# Patient Record
Sex: Male | Born: 1945 | Race: Black or African American | Hispanic: No | Marital: Married | Smoking: Former smoker
Health system: Southern US, Community
[De-identification: ages and names within clinical notes are randomized; demographics above are authoritative.]

## PROBLEM LIST (undated history)

## (undated) DIAGNOSIS — E785 Hyperlipidemia, unspecified: Secondary | ICD-10-CM

## (undated) DIAGNOSIS — I10 Essential (primary) hypertension: Secondary | ICD-10-CM

## (undated) DIAGNOSIS — M109 Gout, unspecified: Secondary | ICD-10-CM

## (undated) DIAGNOSIS — C801 Malignant (primary) neoplasm, unspecified: Secondary | ICD-10-CM

## (undated) HISTORY — DX: Hyperlipidemia, unspecified: E78.5

## (undated) HISTORY — DX: Gout, unspecified: M10.9

## (undated) HISTORY — DX: Essential (primary) hypertension: I10

## (undated) HISTORY — PX: COLONOSCOPY: SHX174

## (undated) HISTORY — PX: PROSTATE SURGERY: SHX751

## (undated) HISTORY — DX: Malignant (primary) neoplasm, unspecified: C80.1

---

## 2006-08-21 ENCOUNTER — Ambulatory Visit (HOSPITAL_COMMUNITY): Admission: RE | Admit: 2006-08-21 | Discharge: 2006-08-21 | Payer: Self-pay | Admitting: Urology

## 2006-11-07 ENCOUNTER — Inpatient Hospital Stay (HOSPITAL_COMMUNITY): Admission: RE | Admit: 2006-11-07 | Discharge: 2006-11-08 | Payer: Self-pay | Admitting: Urology

## 2006-11-07 ENCOUNTER — Encounter (INDEPENDENT_AMBULATORY_CARE_PROVIDER_SITE_OTHER): Payer: Self-pay | Admitting: Urology

## 2007-05-28 ENCOUNTER — Ambulatory Visit: Admission: RE | Admit: 2007-05-28 | Discharge: 2007-08-26 | Payer: Self-pay | Admitting: Radiation Oncology

## 2010-06-28 NOTE — Op Note (Signed)
NAMELUCIANO, [name]                ACCOUNT NO.:  [id number]   MEDICAL RECORD NO.:  [id number]          PATIENT TYPE:  INP   LOCATION:  1440                         FACILITY:  [facility]   PHYSICIAN:  Valetta Fuller, M.D.  DATE OF BIRTH:  January 28, 1946   DATE OF PROCEDURE:  11/07/2006  DATE OF DISCHARGE:  11/08/2006                               OPERATIVE REPORT   PREOPERATIVE DIAGNOSIS:  Adenocarcinoma of the prostate.   POSTOPERATIVE DIAGNOSIS:  Adenocarcinoma of the prostate.   PROCEDURE PERFORMED:  Robotic-assisted laparoscopic radical retropubic  prostatectomy with bilateral pelvic lymph node dissection.   SURGEON:  Valetta Fuller, M.D.   ASSISTANT:  Heloise Purpura, M.D.   ANESTHESIA:  General endotracheal   INDICATIONS:  Mr. [name] is a 65 year old male.  The patient was  originally seen and evaluated by Dr. Logan Bores in our office.  The patient  had had some longstanding erectile dysfunction and had a PSA done  earlier this summer which was markedly elevated at 33.  Rectal exam at  that time showed mild to moderate enlargement but no other worrisome  features.  The patient's prostate volume was around 50 grams.  His  biopsies revealed primarily adenocarcinoma of the prostate on the right  side.  The patient had moderate volume disease with primarily tumor at  the right base and right midportion of the prostate.  There, about 50%  of biopsy material was positive for Gleason 4 + 3 = 7 as well as some  Gleason 3 + 4 = 7 tumor.  CT and bone scan showed no evidence of  metastatic disease.  The patient's preoperative sexual function score  was quite low with minimal voiding complaints.  The patient was noted to  have a significant left inguinal hernia and was sent to general surgery  for consultation.  The plan was to fix this at a later date.  The  patient understood the advantages and disadvantages of this type of  surgery.  He understood there were several different approaches to  managing adenocarcinoma of the prostate of this nature.  He understood  that there was quite a high likelihood of positive margin disease given  his PSA in the 30-40 range.  He understood that adjuvant therapy was  potentially likely.  The patient appeared to understand the advantages,  disadvantages and complications that can occur with this type of  surgery.  The patient has gone to preoperative classes.  He has had some  preoperative physical therapy.  The patient now presents for the  surgery.  He has done a mechanical bowel prep and has received IV  Unasyn.  Compression boots were placed preoperatively.   TECHNIQUE AND FINDINGS:  The patient was brought to operating room where  he had successful induction of general endotracheal anesthesia.  He was  placed in lithotomy position and then into moderate to steep  Trendelenburg.  He was prepped draped in the usual manner.  Sterile  Foley catheter was inserted on the table.   The initial incision site was chosen 18 cm above the pubic symphysis  just to  left of the umbilicus.  A standard open Hasson technique was  utilized.  Fascia was exposed.  The peritoneal cavity was entered again  in the standard open technique, and no obvious adhesions were noted.  The initial 12-mm trocar was then placed, and the abdomen was  insufflated without difficulty.  Careful inspection of the abdomen did  reveal a large left inguinal hernia containing colon.  No other  abnormalities were appreciated.  All other trocars were placed with  direct visual guidance including three 8-mm trocar ports for the robotic  arms, a 12-mm and a 5-mm assist port.  After placement of all the  trocars, the robot was then docked.  The bladder was filled to allow for  entry into the space of Retzius.  Electrocautery scissors were used to  reflect the bladder posteriorly allowing for the entry into the space of  Retzius.  Endopelvic fascia was identified, and the overlying  prostate  and superficial dorsal vein complex were dissected clear, and some  superficial defatting was performed.  The endopelvic fascia was then  incised bilaterally.  Puboprostatic ligaments were taken down sharply,  and once the levator musculature was swept off the apex of the prostate,  the dorsal venous complex was isolated and then stapled with an ETS  stapling device.  With the use of the Foley catheter, we were able to  identify the anterior bladder neck, and electrocautery scissors were  used to incise the anterior bladder neck.  The Foley catheter was  identified.  There was no evidence of middle lobe.  Indigo carmine was  given to identify the ureteral orifices which were well back from the  incision site.  The posterior bladder neck was then transected.  Seminal  vesicles and vas structures were then identified posteriorly, and each  of these structures were individually dissected free.  With anterior  traction, the posterior plane between the rectum and prostate was then  easily established.   Attention was then turned towards isolation of the pedicles.  We did go  ahead and sweep off the neurovascular bundle off the left side of the  prostate where all the biopsies were negative.  We realized that the  patient had significant preoperative erectile dysfunction, but there may  be some improvement with sparing of the neurovascular bundle on that  side.  On the contralateral right side, wide excision of the  neurovascular bundle was accomplished.  The vascular pedicles were  isolated, and Hem-o-lok clips were used for those.  At that point, the  prostate remained attached only to the apex and urethra.  Urethra was  divided anteriorly.  The catheter was retracted, and the posterior  urethra was then transected.  The prostatic specimen was then brought  out of the pelvis.  Irrigation of pelvis revealed no active bleeding and  no evidence of rectal injury.   Attention was then  turned towards bilateral pelvic lymph node  dissection.  There were a few slightly enlarged nodes, but nothing  bigger than 1 cm bilaterally.  Obturator packets were taken back to the  bifurcation of the iliac artery.  Clips were used.  Obturator nerves and  vessels were identified bilaterally and preserved.  Both of these bundle  packets were taken out separately.   Attention was then turned towards reconstruction.  A 2-0 Vicryl suture  was utilized to reconstruct Denonvilliers fascia to provide floor and  support for the bladder and bladder neck region.  Once this was  accomplished, we placed a single 2-0 Vicryl suture at the 6 o'clock  position between the posterior urethra and posterior bladder neck.  Again, indigo carmine was given to help identify the orifices.  Once the  urethra and bladder neck were reapproximated, we then used a double-  armed 3-0 Monocryl suture to perform a running 360 degrees anastomosis.  A new coude catheter was then placed and the bladder irrigated with no  evidence of leakage and what appeared to be a good watertight  anastomosis.  The pelvic drain was placed through one of the obturator  ports and positioned in the retropubic space and then secured to the  skin.   Attention was then turned towards specimen removal.  An Endopouch was  used to obtain the prostate.  The 12-mm assist port was closed with 0  Vicryl suture with the aid of a suture passer.  The midline incision was  slightly enlarged to remove the prostatic specimen.  That incision was  then closed with a running 0 Vicryl suture.  All wounds were infiltrated  with Marcaine, and clips were used for skin closure.  The patient  appeared to tolerate the procedure well.  There were no obvious  complications, and blood loss was within a few hundred mL.  He was  brought to the recovery room in stable condition.          ______________________________  Valetta Fuller, M.D.  Electronically  Signed    DSG/MEDQ  D:  11/12/2006  T:  11/12/2006  Job:  [id number]

## 2010-11-24 LAB — DIFFERENTIAL
Eosinophils Absolute: 0
Lymphocytes Relative: 18
Lymphs Abs: 2
Monocytes Relative: 7
Neutrophils Relative %: 75

## 2010-11-24 LAB — BASIC METABOLIC PANEL
CO2: 25
Chloride: 103
Chloride: 105
Creatinine, Ser: 1
GFR calc Af Amer: >60
GFR calc Af Amer: >60
Potassium: 3.4 — ABNORMAL LOW
Potassium: 3.8
Sodium: 136

## 2010-11-24 LAB — HEMOGLOBIN AND HEMATOCRIT, BLOOD: HCT: 41.3

## 2010-11-24 LAB — CBC
HCT: 36.1 — ABNORMAL LOW
MCV: 99.2
RBC: 3.64 — ABNORMAL LOW
WBC: 10.9 — ABNORMAL HIGH

## 2012-04-24 ENCOUNTER — Ambulatory Visit (INDEPENDENT_AMBULATORY_CARE_PROVIDER_SITE_OTHER): Payer: Medicare Other | Admitting: Family Medicine

## 2012-04-24 VITALS — BP 130/64 | HR 81 | Temp 98.1°F | Resp 16 | Ht 70.0 in | Wt 211.6 lb

## 2012-04-24 DIAGNOSIS — J209 Acute bronchitis, unspecified: Secondary | ICD-10-CM

## 2012-04-24 NOTE — Progress Notes (Signed)
SUBJECTIVE:  [name] is a 67 y.o. male who complains of congestion, sore throat and post nasal drip for 14 days. He denies a history of chest pain, chills, dizziness, fatigue, fevers, myalgias, shortness of breath, sweats, vomiting, weight loss and sputum production and denies a history of asthma. Patient denies smoke cigarettes. Patient has a past medical history significant for sinusitis approximately one year ago and states that this feels very similar.   OBJECTIVE: He appears well, vital signs are as noted. Ears fluid behind TM, no sign of infection.  Throat and pharynx mild erythems.  Neck supple. No adenopathy in the neck. Nose is congested. Sinuses non tender. The chest is clear, without wheezes or rales.  ASSESSMENT:  viral upper respiratory illness and sinusitis  PLAN: Symptomatic therapy suggested: push fluids, rest, gargle warm salt water, return office visit prn if symptoms persist or worsen and augmentin per orders. Call or return to clinic prn if these symptoms worsen or fail to improve as anticipated.

## 2012-04-24 NOTE — Patient Instructions (Signed)
Nice to meet you I do think you have a sinus infection. I giving you Augmentin. Take one pill twice daily for the next 10 days. If you have fevers or chills or feel that your becoming short of breath please come back or see your primary care [name].    Sinusitis Sinusitis is redness, soreness, and swelling (inflammation) of the paranasal sinuses. Paranasal sinuses are air pockets within the bones of your face (beneath the eyes, the middle of the forehead, or above the eyes). In healthy paranasal sinuses, mucus is able to drain out, and air is able to circulate through them by way of your nose. However, when your paranasal sinuses are inflamed, mucus and air can become trapped. This can allow bacteria and other germs to grow and cause infection. Sinusitis can develop quickly and last only a short time (acute) or continue over a long period (chronic). Sinusitis that lasts for more than 12 weeks is considered chronic.  CAUSES  Causes of sinusitis include:  Allergies.  Structural abnormalities, such as displacement of the cartilage that separates your nostrils (deviated septum), which can decrease the air flow through your nose and sinuses and affect sinus drainage.  Functional abnormalities, such as when the small hairs (cilia) that line your sinuses and help remove mucus do not work properly or are not present. SYMPTOMS  Symptoms of acute and chronic sinusitis are the same. The primary symptoms are pain and pressure around the affected sinuses. Other symptoms include:  Upper toothache.  Earache.  Headache.  Bad breath.  Decreased sense of smell and taste.  A cough, which worsens when you are lying flat.  Fatigue.  Fever.  Thick drainage from your nose, which often is green and may contain pus (purulent).  Swelling and warmth over the affected sinuses. DIAGNOSIS  Your caregiver will perform a physical exam. During the exam, your caregiver may:  Look in your nose for signs of  abnormal growths in your nostrils (nasal polyps).  Tap over the affected sinus to check for signs of infection.  View the inside of your sinuses (endoscopy) with a special imaging device with a light attached (endoscope), which is inserted into your sinuses. If your caregiver suspects that you have chronic sinusitis, one or more of the following tests may be recommended:  Allergy tests.  Nasal culture A sample of mucus is taken from your nose and sent to a lab and screened for bacteria.  Nasal cytology A sample of mucus is taken from your nose and examined by your caregiver to determine if your sinusitis is related to an allergy. TREATMENT  Most cases of acute sinusitis are related to a viral infection and will resolve on their own within 10 days. Sometimes medicines are prescribed to help relieve symptoms (pain medicine, decongestants, nasal steroid sprays, or saline sprays).  However, for sinusitis related to a bacterial infection, your caregiver will prescribe antibiotic medicines. These are medicines that will help kill the bacteria causing the infection.  Rarely, sinusitis is caused by a fungal infection. In theses cases, your caregiver will prescribe antifungal medicine. For some cases of chronic sinusitis, surgery is needed. Generally, these are cases in which sinusitis recurs more than 3 times per year, despite other treatments. HOME CARE INSTRUCTIONS   Drink plenty of water. Water helps thin the mucus so your sinuses can drain more easily.  Use a humidifier.  Inhale steam 3 to 4 times a day (for example, sit in the bathroom with the shower running).  Apply a warm, moist washcloth to your face 3 to 4 times a day, or as directed by your caregiver.  Use saline nasal sprays to help moisten and clean your sinuses.  Take over-the-counter or prescription medicines for pain, discomfort, or fever only as directed by your caregiver. SEEK IMMEDIATE MEDICAL CARE IF:  You have increasing  pain or severe headaches.  You have nausea, vomiting, or drowsiness.  You have swelling around your face.  You have vision problems.  You have a stiff neck.  You have difficulty breathing. MAKE SURE YOU:   Understand these instructions.  Will watch your condition.  Will get help right away if you are not doing well or get worse. Document Released: 01/30/2005 Document Revised: 04/24/2011 Document Reviewed: 02/14/2011 [name] Patient Information 2013 [place], [place].

## 2012-04-25 ENCOUNTER — Telehealth: Payer: Self-pay | Admitting: Radiology

## 2012-04-25 MED ORDER — AMOXICILLIN-POT CLAVULANATE 875-125 MG PO TABS: 1.0000 | ORAL_TABLET | Freq: Two times a day (BID) | ORAL | Status: DC

## 2012-04-25 NOTE — Telephone Encounter (Signed)
Rx sent to pharmacy and patient notified.   

## 2012-04-25 NOTE — Telephone Encounter (Signed)
This patient per Dr. Timothy Lasso Smith's chart was supposed to get Augmentin called into [facility] on [address] [facility], but it was not sent. Please address. Thanks.

## 2013-04-23 ENCOUNTER — Other Ambulatory Visit: Payer: Self-pay | Admitting: Family Medicine

## 2013-04-23 DIAGNOSIS — R0989 Other specified symptoms and signs involving the circulatory and respiratory systems: Secondary | ICD-10-CM

## 2013-04-28 ENCOUNTER — Ambulatory Visit
Admission: RE | Admit: 2013-04-28 | Discharge: 2013-04-28 | Disposition: A | Discharge status: Home / Self Care | Payer: Medicare Other | Source: Ambulatory Visit | Attending: Family Medicine | Admitting: Family Medicine

## 2013-04-28 DIAGNOSIS — R0989 Other specified symptoms and signs involving the circulatory and respiratory systems: Secondary | ICD-10-CM

## 2013-06-06 ENCOUNTER — Encounter: Payer: Self-pay | Admitting: Surgery

## 2013-06-09 ENCOUNTER — Encounter: Payer: Self-pay | Admitting: Surgery

## 2013-06-09 ENCOUNTER — Ambulatory Visit (INDEPENDENT_AMBULATORY_CARE_PROVIDER_SITE_OTHER): Payer: Medicare Other | Admitting: Surgery

## 2013-06-09 VITALS — BP 158/80 | HR 55 | Ht 70.0 in | Wt 210.0 lb

## 2013-06-09 DIAGNOSIS — I714 Abdominal aortic aneurysm, without rupture, unspecified: Secondary | ICD-10-CM

## 2013-06-09 DIAGNOSIS — I6529 Occlusion and stenosis of unspecified carotid artery: Secondary | ICD-10-CM | POA: Insufficient documentation

## 2013-06-09 NOTE — Progress Notes (Signed)
Patient name: [name] MRN: [id number] DOB: November 21, 1945 Sex: male   Referred by: Dr. Drema Dallas  Reason for referral:  Chief Complaint  Patient presents with  . Carotid    new pt, carotid stenosis     HISTORY OF PRESENT ILLNESS: This is a very pleasant 68 year old male who was referred today for evaluation of carotid stenosis.  A carotid bruit was detected on physical exam.  This led to a carotid ultrasound study which revealed 50-69% left internal carotid stenosis and less than 50% right internal carotid stenosis.  The patient is asymptomatic.  Specifically, he denies numbness or weakness in either extremity.  He denies slurred speech.  He denies amaurosis fugax.  The patient's most recent cholesterol profile shows an LDL of 1 L1 and an HDL of 35.  He is not on a statin currently.  He is on single agent therapy for hypertension.  He has a history of smoking but none currently.  He quit in 2000.  He has a history of prostate cancer, status post prostatectomy and external beam radiation.  Past Medical History  Diagnosis Date  . Cancer     Past Surgical History  Procedure Laterality Date  . Prostate surgery      History   Social History  . Marital Status: Married    Spouse Name: N/A    Number of Children: N/A  . Years of Education: N/A   Occupational History  . Not on file.   Social History Main Topics  . Smoking status: Former Smoker    Quit date: 02/13/1998  . Smokeless tobacco: Not on file  . Alcohol Use: No  . Drug Use: No  . Sexual Activity: Not on file   Other Topics Concern  . Not on file   Social History Narrative  . No narrative on file    History reviewed. No pertinent family history.  Allergies as of 06/09/2013  . (No Known Allergies)    Current Outpatient Prescriptions on File Prior to Visit  Medication Sig Dispense Refill  . amoxicillin-clavulanate (AUGMENTIN) 875-125 MG per tablet Take 1 tablet by mouth 2 (two) times daily.  20 tablet  0    . HYDROCHLOROTHIAZIDE PO Take 1 tablet by mouth daily.       No current facility-administered medications on file prior to visit.     REVIEW OF SYSTEMS: Cardiovascular: No chest pain, chest pressure, palpitations, orthopnea, or dyspnea on exertion. No claudication or rest pain,  No history of DVT or phlebitis. Pulmonary: No productive cough, asthma or wheezing. Neurologic: No weakness, paresthesias, aphasia, or amaurosis. No dizziness. Hematologic: No bleeding problems or clotting disorders. Musculoskeletal: No joint pain or joint swelling. Gastrointestinal: No blood in stool or hematemesis Genitourinary: No dysuria or hematuria. Psychiatric:: No history of major depression. Integumentary: No rashes or ulcers. Constitutional: No fever or chills.  PHYSICAL EXAMINATION: General: The patient appears their stated age.  Vital signs are BP 158/80  Pulse 55  Ht 5\' 10"  (1.778 m)  Wt 210 lb (95.255 kg)  BMI 30.13 kg/m2  SpO2 99% HEENT:  No gross abnormalities Pulmonary: Respirations are non-labored Abdomen: Soft and non-tender.  Aorta is pulsatile and palpable  Musculoskeletal: There are no major deformities.   Neurologic: No focal weakness or paresthesias are detected, Skin: There are no ulcer or rashes noted. Psychiatric: The patient has normal affect. Cardiovascular: There is a regular rate and rhythm without significant murmur appreciated.  Bilateral carotid bruit  Diagnostic Studies: I have  reviewed his carotid ultrasound study which shows 50-69% left carotid stenosis and less than 50% right carotid stenosis    Assessment:  Asymptomatic carotid stenosis, left greater than right Plan: I discussed with the patient in the treatment strategies for asymptomatic carotid stenosis.  As long as he remains less than 80% stenosis, he is best served with medical management.  In order to optimize his medical treatment, I am adding a baby aspirin to his medical therapy, to be taken daily.   I would currently favors starting him on a statin for his hypercholesterolemia, and order to get his LDL profile slightly better.  I would defer to Dr. Drema Dallas for implementation of this.  I discussed the signs and symptoms of a stroke and what to do should they occur.  The patient is scheduled to followup again in 6 months with a carotid ultrasound.  In addition I am going to get an abdominal ultrasound giving the pulsatility that was observed on his abdominal exam today.     Eldridge Abrahams, M.D. [facility] Office: [phone] Pager:  [phone]

## 2013-06-10 NOTE — Addendum Note (Signed)
Addended by: Mena Goes on: 06/10/2013 01:33 PM   Modules accepted: Orders

## 2013-12-26 ENCOUNTER — Encounter: Payer: Self-pay | Admitting: Surgery

## 2013-12-29 ENCOUNTER — Ambulatory Visit: Payer: Medicare Other | Admitting: Surgery

## 2013-12-29 ENCOUNTER — Ambulatory Visit (HOSPITAL_COMMUNITY)
Admission: RE | Admit: 2013-12-29 | Discharge: 2013-12-29 | Disposition: A | Discharge status: Home / Self Care | Payer: Medicare Other | Source: Ambulatory Visit | Attending: Surgery | Admitting: Surgery

## 2013-12-29 DIAGNOSIS — I6523 Occlusion and stenosis of bilateral carotid arteries: Secondary | ICD-10-CM

## 2013-12-29 DIAGNOSIS — I714 Abdominal aortic aneurysm, without rupture, unspecified: Secondary | ICD-10-CM

## 2014-04-28 ENCOUNTER — Other Ambulatory Visit: Payer: Self-pay | Admitting: Family Medicine

## 2014-04-28 DIAGNOSIS — I6529 Occlusion and stenosis of unspecified carotid artery: Secondary | ICD-10-CM

## 2014-04-28 DIAGNOSIS — R198 Other specified symptoms and signs involving the digestive system and abdomen: Secondary | ICD-10-CM

## 2014-05-22 ENCOUNTER — Ambulatory Visit
Admission: RE | Admit: 2014-05-22 | Discharge: 2014-05-22 | Disposition: A | Discharge status: Home / Self Care | Payer: PPO | Source: Ambulatory Visit | Attending: Family Medicine | Admitting: Family Medicine

## 2014-05-22 ENCOUNTER — Other Ambulatory Visit: Payer: Self-pay | Admitting: Family Medicine

## 2014-05-22 DIAGNOSIS — R198 Other specified symptoms and signs involving the digestive system and abdomen: Secondary | ICD-10-CM

## 2014-05-22 DIAGNOSIS — I6529 Occlusion and stenosis of unspecified carotid artery: Secondary | ICD-10-CM

## 2015-04-22 DIAGNOSIS — E7800 Pure hypercholesterolemia, unspecified: Secondary | ICD-10-CM | POA: Diagnosis not present

## 2015-04-22 DIAGNOSIS — I10 Essential (primary) hypertension: Secondary | ICD-10-CM | POA: Diagnosis not present

## 2015-04-22 DIAGNOSIS — Z0000 Encounter for general adult medical examination without abnormal findings: Secondary | ICD-10-CM | POA: Diagnosis not present

## 2015-05-05 DIAGNOSIS — R935 Abnormal findings on diagnostic imaging of other abdominal regions, including retroperitoneum: Secondary | ICD-10-CM | POA: Diagnosis not present

## 2015-05-05 DIAGNOSIS — Z1389 Encounter for screening for other disorder: Secondary | ICD-10-CM | POA: Diagnosis not present

## 2015-05-05 DIAGNOSIS — I10 Essential (primary) hypertension: Secondary | ICD-10-CM | POA: Diagnosis not present

## 2015-05-05 DIAGNOSIS — I6529 Occlusion and stenosis of unspecified carotid artery: Secondary | ICD-10-CM | POA: Diagnosis not present

## 2015-05-05 DIAGNOSIS — Z79899 Other long term (current) drug therapy: Secondary | ICD-10-CM | POA: Diagnosis not present

## 2015-05-05 DIAGNOSIS — D649 Anemia, unspecified: Secondary | ICD-10-CM | POA: Diagnosis not present

## 2015-05-05 DIAGNOSIS — E7800 Pure hypercholesterolemia, unspecified: Secondary | ICD-10-CM | POA: Diagnosis not present

## 2015-05-05 DIAGNOSIS — Z0000 Encounter for general adult medical examination without abnormal findings: Secondary | ICD-10-CM | POA: Diagnosis not present

## 2015-05-05 DIAGNOSIS — N183 Chronic kidney disease, stage 3 (moderate): Secondary | ICD-10-CM | POA: Diagnosis not present

## 2015-05-06 ENCOUNTER — Other Ambulatory Visit: Payer: Self-pay | Admitting: Family Medicine

## 2015-05-06 DIAGNOSIS — R935 Abnormal findings on diagnostic imaging of other abdominal regions, including retroperitoneum: Secondary | ICD-10-CM

## 2015-05-06 DIAGNOSIS — I6529 Occlusion and stenosis of unspecified carotid artery: Secondary | ICD-10-CM

## 2015-05-17 DIAGNOSIS — C61 Malignant neoplasm of prostate: Secondary | ICD-10-CM | POA: Diagnosis not present

## 2015-05-24 ENCOUNTER — Other Ambulatory Visit: Payer: PPO

## 2015-05-24 ENCOUNTER — Ambulatory Visit
Admission: RE | Admit: 2015-05-24 | Discharge: 2015-05-24 | Disposition: A | Discharge status: Home / Self Care | Payer: PPO | Source: Ambulatory Visit | Attending: Family Medicine | Admitting: Family Medicine

## 2015-05-24 DIAGNOSIS — I10 Essential (primary) hypertension: Secondary | ICD-10-CM | POA: Diagnosis not present

## 2015-05-24 DIAGNOSIS — Z79899 Other long term (current) drug therapy: Secondary | ICD-10-CM | POA: Diagnosis not present

## 2015-05-24 DIAGNOSIS — R935 Abnormal findings on diagnostic imaging of other abdominal regions, including retroperitoneum: Secondary | ICD-10-CM | POA: Diagnosis not present

## 2015-05-24 DIAGNOSIS — Z0000 Encounter for general adult medical examination without abnormal findings: Secondary | ICD-10-CM | POA: Diagnosis not present

## 2015-05-24 DIAGNOSIS — N183 Chronic kidney disease, stage 3 (moderate): Secondary | ICD-10-CM | POA: Diagnosis not present

## 2015-05-24 DIAGNOSIS — C61 Malignant neoplasm of prostate: Secondary | ICD-10-CM | POA: Diagnosis not present

## 2015-05-24 DIAGNOSIS — Z1389 Encounter for screening for other disorder: Secondary | ICD-10-CM | POA: Diagnosis not present

## 2015-05-24 DIAGNOSIS — D649 Anemia, unspecified: Secondary | ICD-10-CM | POA: Diagnosis not present

## 2015-05-24 DIAGNOSIS — I6529 Occlusion and stenosis of unspecified carotid artery: Secondary | ICD-10-CM | POA: Diagnosis not present

## 2015-05-24 DIAGNOSIS — I6523 Occlusion and stenosis of bilateral carotid arteries: Secondary | ICD-10-CM | POA: Diagnosis not present

## 2015-05-24 DIAGNOSIS — Z136 Encounter for screening for cardiovascular disorders: Secondary | ICD-10-CM | POA: Diagnosis not present

## 2015-05-24 DIAGNOSIS — E7800 Pure hypercholesterolemia, unspecified: Secondary | ICD-10-CM | POA: Diagnosis not present

## 2015-11-22 DIAGNOSIS — C61 Malignant neoplasm of prostate: Secondary | ICD-10-CM | POA: Diagnosis not present

## 2015-11-29 DIAGNOSIS — C61 Malignant neoplasm of prostate: Secondary | ICD-10-CM | POA: Diagnosis not present

## 2016-05-01 DIAGNOSIS — E7800 Pure hypercholesterolemia, unspecified: Secondary | ICD-10-CM | POA: Diagnosis not present

## 2016-05-01 DIAGNOSIS — I10 Essential (primary) hypertension: Secondary | ICD-10-CM | POA: Diagnosis not present

## 2016-05-01 DIAGNOSIS — N183 Chronic kidney disease, stage 3 (moderate): Secondary | ICD-10-CM | POA: Diagnosis not present

## 2016-05-03 DIAGNOSIS — C61 Malignant neoplasm of prostate: Secondary | ICD-10-CM | POA: Diagnosis not present

## 2016-05-08 DIAGNOSIS — R718 Other abnormality of red blood cells: Secondary | ICD-10-CM | POA: Diagnosis not present

## 2016-05-08 DIAGNOSIS — E7800 Pure hypercholesterolemia, unspecified: Secondary | ICD-10-CM | POA: Diagnosis not present

## 2016-05-08 DIAGNOSIS — I10 Essential (primary) hypertension: Secondary | ICD-10-CM | POA: Diagnosis not present

## 2016-05-08 DIAGNOSIS — Z0000 Encounter for general adult medical examination without abnormal findings: Secondary | ICD-10-CM | POA: Diagnosis not present

## 2016-05-08 DIAGNOSIS — N183 Chronic kidney disease, stage 3 (moderate): Secondary | ICD-10-CM | POA: Diagnosis not present

## 2016-05-08 DIAGNOSIS — I6529 Occlusion and stenosis of unspecified carotid artery: Secondary | ICD-10-CM | POA: Diagnosis not present

## 2016-05-08 DIAGNOSIS — Z1389 Encounter for screening for other disorder: Secondary | ICD-10-CM | POA: Diagnosis not present

## 2016-05-08 DIAGNOSIS — C61 Malignant neoplasm of prostate: Secondary | ICD-10-CM | POA: Diagnosis not present

## 2016-05-10 DIAGNOSIS — C61 Malignant neoplasm of prostate: Secondary | ICD-10-CM | POA: Diagnosis not present

## 2016-05-11 ENCOUNTER — Other Ambulatory Visit: Payer: Self-pay | Admitting: Family Medicine

## 2016-05-11 DIAGNOSIS — I6529 Occlusion and stenosis of unspecified carotid artery: Secondary | ICD-10-CM

## 2016-05-19 ENCOUNTER — Ambulatory Visit
Admission: RE | Admit: 2016-05-19 | Discharge: 2016-05-19 | Disposition: A | Discharge status: Home / Self Care | Payer: PPO | Source: Ambulatory Visit | Attending: Family Medicine | Admitting: Family Medicine

## 2016-05-19 DIAGNOSIS — I6529 Occlusion and stenosis of unspecified carotid artery: Secondary | ICD-10-CM

## 2016-05-19 DIAGNOSIS — I6523 Occlusion and stenosis of bilateral carotid arteries: Secondary | ICD-10-CM | POA: Diagnosis not present

## 2016-11-08 DIAGNOSIS — C61 Malignant neoplasm of prostate: Secondary | ICD-10-CM | POA: Diagnosis not present

## 2016-11-15 DIAGNOSIS — C61 Malignant neoplasm of prostate: Secondary | ICD-10-CM | POA: Diagnosis not present

## 2017-05-07 DIAGNOSIS — E7800 Pure hypercholesterolemia, unspecified: Secondary | ICD-10-CM | POA: Diagnosis not present

## 2017-05-07 DIAGNOSIS — Z79899 Other long term (current) drug therapy: Secondary | ICD-10-CM | POA: Diagnosis not present

## 2017-05-07 DIAGNOSIS — N183 Chronic kidney disease, stage 3 (moderate): Secondary | ICD-10-CM | POA: Diagnosis not present

## 2017-05-07 DIAGNOSIS — R7989 Other specified abnormal findings of blood chemistry: Secondary | ICD-10-CM | POA: Diagnosis not present

## 2017-05-10 DIAGNOSIS — Z1211 Encounter for screening for malignant neoplasm of colon: Secondary | ICD-10-CM | POA: Diagnosis not present

## 2017-05-10 DIAGNOSIS — Z0000 Encounter for general adult medical examination without abnormal findings: Secondary | ICD-10-CM | POA: Diagnosis not present

## 2017-05-10 DIAGNOSIS — R7989 Other specified abnormal findings of blood chemistry: Secondary | ICD-10-CM | POA: Diagnosis not present

## 2017-05-10 DIAGNOSIS — Z1389 Encounter for screening for other disorder: Secondary | ICD-10-CM | POA: Diagnosis not present

## 2017-05-11 DIAGNOSIS — C61 Malignant neoplasm of prostate: Secondary | ICD-10-CM | POA: Diagnosis not present

## 2017-05-17 ENCOUNTER — Other Ambulatory Visit (HOSPITAL_COMMUNITY): Payer: Self-pay | Admitting: Urology

## 2017-05-17 DIAGNOSIS — R9721 Rising PSA following treatment for malignant neoplasm of prostate: Secondary | ICD-10-CM

## 2017-05-17 DIAGNOSIS — C61 Malignant neoplasm of prostate: Secondary | ICD-10-CM | POA: Diagnosis not present

## 2017-05-21 ENCOUNTER — Telehealth: Payer: Self-pay | Admitting: Genetic Counselor

## 2017-05-21 ENCOUNTER — Telehealth: Payer: Self-pay | Admitting: Hematology and Oncology

## 2017-05-21 ENCOUNTER — Encounter: Payer: Self-pay | Admitting: Hematology and Oncology

## 2017-05-21 NOTE — Telephone Encounter (Signed)
A genetic counseling appt has been scheduled for the pt to see Steele Berg on 4/25 at 10:15am. Pt aware to arrive 30 minutes early. Letter mailed.

## 2017-05-21 NOTE — Telephone Encounter (Signed)
A hematology appt has been scheduled for the pt to see Dr. Lindi Adie on 5/7 at 3:45pm. Letter with the appt date and time to the pt and faxed to the referring.

## 2017-05-30 ENCOUNTER — Encounter (HOSPITAL_COMMUNITY)
Admission: RE | Admit: 2017-05-30 | Discharge: 2017-05-30 | Disposition: A | Discharge status: Home / Self Care | Payer: PPO | Source: Ambulatory Visit | Attending: Urology | Admitting: Urology

## 2017-05-30 DIAGNOSIS — R9721 Rising PSA following treatment for malignant neoplasm of prostate: Secondary | ICD-10-CM | POA: Diagnosis not present

## 2017-05-30 DIAGNOSIS — C61 Malignant neoplasm of prostate: Secondary | ICD-10-CM | POA: Diagnosis not present

## 2017-05-30 MED ORDER — TECHNETIUM TC 99M MEDRONATE IV KIT
21.4000 | PACK | Freq: Once | INTRAVENOUS | Status: AC | PRN
  Administered 2017-05-30: 21.4 via INTRAVENOUS

## 2017-06-11 DIAGNOSIS — C61 Malignant neoplasm of prostate: Secondary | ICD-10-CM | POA: Diagnosis not present

## 2017-06-19 ENCOUNTER — Inpatient Hospital Stay: Payer: PPO

## 2017-06-19 ENCOUNTER — Inpatient Hospital Stay: Payer: PPO | Attending: Hematology and Oncology | Admitting: Hematology and Oncology

## 2017-06-19 VITALS — BP 167/74 | HR 64 | Temp 98.2°F | Resp 18 | Ht 70.0 in | Wt 217.5 lb

## 2017-06-19 DIAGNOSIS — D696 Thrombocytopenia, unspecified: Secondary | ICD-10-CM

## 2017-06-19 DIAGNOSIS — Z87891 Personal history of nicotine dependence: Secondary | ICD-10-CM

## 2017-06-19 DIAGNOSIS — Z8546 Personal history of malignant neoplasm of prostate: Secondary | ICD-10-CM

## 2017-06-19 DIAGNOSIS — I10 Essential (primary) hypertension: Secondary | ICD-10-CM

## 2017-06-19 DIAGNOSIS — D649 Anemia, unspecified: Secondary | ICD-10-CM | POA: Insufficient documentation

## 2017-06-19 LAB — CBC WITH DIFFERENTIAL (CANCER CENTER ONLY)
BASOS ABS: 0 10*3/uL (ref 0.0–0.1)
BASOS PCT: 1 %
Eosinophils Absolute: 0.2 10*3/uL (ref 0.0–0.5)
Eosinophils Relative: 3 %
HEMATOCRIT: 36.7 % — AB (ref 38.4–49.9)
Hemoglobin: 12.1 g/dL — ABNORMAL LOW (ref 13.0–17.1)
Lymphocytes Relative: 45 %
Lymphs Abs: 2.9 10*3/uL (ref 0.9–3.3)
MCH: 32.5 pg (ref 27.2–33.4)
MCHC: 33 g/dL (ref 32.0–36.0)
MCV: 98.7 fL — ABNORMAL HIGH (ref 79.3–98.0)
MONOS PCT: 8 %
Monocytes Absolute: 0.5 10*3/uL (ref 0.1–0.9)
NEUTROS ABS: 2.8 10*3/uL (ref 1.5–6.5)
Neutrophils Relative %: 43 %
Platelet Count: 155 10*3/uL (ref 140–400)
RBC: 3.72 MIL/uL — ABNORMAL LOW (ref 4.20–5.82)
RDW: 13.2 % (ref 11.0–14.6)
WBC Count: 6.5 10*3/uL (ref 4.0–10.3)

## 2017-06-19 NOTE — Assessment & Plan Note (Addendum)
Previous lab work suggested later clumping. CBC results reviewed  05/10/2017: Hemoglobin 12.4, MCV 100.3, platelets 114 05/07/2017: Hemoglobin 12.5, MCV 99.6, platelets 95 05/08/2016: Hemoglobin 12.9, MCV 97.6, platelets 131 05/01/2016: Hemoglobin 12.5, MCV 96.7, platelets not reported 05/24/2015: Hemoglobin 13.6, MCV 98.7, platelets 193  I discussed with the patient that his hemoglobin is fairly reasonable and does not appear to be of any concern.  I did not recommend doing any additional investigations unless the hemoglobin drops below 10 g.  For the thrombocytopenia I obtained a CBC and a blue top tube to prevent clumping.  We will rule out spurious thrombocytopenia before considering additional work-ups.  Blood count review 06/19/2017: Platelet count 155 and hemoglobin of 12.1 10 years ago patient had a hemoglobin of 12.3. I believe that the low platelets being found previously were related to platelet clumping.  Since the platelet counts are fairly reasonable, there is no need for immediate treatment.  I recommended watchful monitoring.  Please do not hesitate to contact us if his platelet counts drop below 100 or his hemoglobin drops below 10 g. Thank you very much for allowing [place] to participate in his care.

## 2017-06-19 NOTE — Progress Notes (Signed)
[facility] CONSULT NOTE  Patient Care Team: Leighton Ruff, MD as PCP - General (Family Medicine)  CHIEF COMPLAINTS/PURPOSE OF CONSULTATION:  Mild anemia and thrombocytopenia  HISTORY OF PRESENTING ILLNESS:  [name] 72 y.o. male is here because of recent diagnosis of mild anemia and thrombocytopenia.  Patient was in his usual state of good health when he went for a routine annual checkup and his platelet count could not be determined because of clumping.  Previously his platelet counts have been all over including a platelet count of 94.  But there was a notation of platelet clumping made for these blood counts.  In addition he had mild anemia with a hemoglobin of 12.3.  He had mild macrocytosis as well.  He does not have any symptoms of fatigue or shortness of breath to exertion.  He denies any bruising or bleeding excessively.  I reviewed her records extensively and collaborated the history with the patient.  MEDICAL HISTORY:  Past Medical History:  Diagnosis Date  . Cancer [facility])   Prostate cancer, hypertension, hyperlipidemia, anxiety  SURGICAL HISTORY: Past Surgical History:  Procedure Laterality Date  . PROSTATE SURGERY      SOCIAL HISTORY: Previous smoker quit in 2000, denies any alcohol or occasional drug use FAMILY HISTORY: No family history of cancers or blood disorders ALLERGIES:  has No Known Allergies.  MEDICATIONS:  Current Outpatient Medications  Medication Sig Dispense Refill  . amoxicillin-clavulanate (AUGMENTIN) 875-125 MG per tablet Take 1 tablet by mouth 2 (two) times daily. 20 tablet 0  . HYDROCHLOROTHIAZIDE PO Take 1 tablet by mouth daily.     No current facility-administered medications for this visit.     REVIEW OF SYSTEMS:   Constitutional: Denies fevers, chills or abnormal night sweats Eyes: Denies blurriness of vision, double vision or watery eyes Ears, nose, mouth, throat, and face: Denies mucositis or sore  throat Respiratory: Denies cough, dyspnea or wheezes Cardiovascular: Denies palpitation, chest discomfort or lower extremity swelling Gastrointestinal:  Denies nausea, heartburn or change in bowel habits Skin: Denies abnormal skin rashes Lymphatics: Denies new lymphadenopathy or easy bruising Neurological:Denies numbness, tingling or new weaknesses Behavioral/Psych: Mood is stable, no new changes  All other systems were reviewed with the patient and are negative.  PHYSICAL EXAMINATION: ECOG PERFORMANCE STATUS: 1 - Symptomatic but completely ambulatory  Vitals:   06/19/17 1553  BP: (!) 167/74  Pulse: 64  Resp: 18  Temp: 98.2 F (36.8 C)  SpO2: 100%   Filed Weights   06/19/17 1553  Weight: 217 lb 8 oz (98.7 kg)    GENERAL:alert, no distress and comfortable SKIN: skin color, texture, turgor are normal, no rashes or significant lesions EYES: normal, conjunctiva are pink and non-injected, sclera clear OROPHARYNX:no exudate, no erythema and lips, buccal mucosa, and tongue normal  NECK: supple, thyroid normal size, non-tender, without nodularity LYMPH:  no palpable lymphadenopathy in the cervical, axillary or inguinal LUNGS: clear to auscultation and percussion with normal breathing effort HEART: regular rate & rhythm and no murmurs and no lower extremity edema ABDOMEN:abdomen soft, non-tender and normal bowel sounds Musculoskeletal:no cyanosis of digits and no clubbing  PSYCH: alert & oriented x 3 with fluent speech NEURO: no focal motor/sensory deficits   LABORATORY DATA:  I have reviewed the data as listed Lab Results  Component Value Date   WBC 6.5 06/19/2017   HGB 12.1 (L) 06/19/2017   HCT 36.7 (L) 06/19/2017   MCV 98.7 (H) 06/19/2017   PLT 155 06/19/2017  Lab Results  Component Value Date   NA 139 11/08/2006   K 3.8 11/08/2006   CL 105 11/08/2006   CO2 29 11/08/2006    RADIOGRAPHIC STUDIES: I have personally reviewed the radiological reports and agreed with  the findings in the report.  ASSESSMENT AND PLAN:  Thrombocytopenia ([place]) Previous lab work suggested later clumping. CBC results reviewed  05/10/2017: Hemoglobin 12.4, MCV 100.3, platelets 114 05/07/2017: Hemoglobin 12.5, MCV 99.6, platelets 95 05/08/2016: Hemoglobin 12.9, MCV 97.6, platelets 131 05/01/2016: Hemoglobin 12.5, MCV 96.7, platelets not reported 05/24/2015: Hemoglobin 13.6, MCV 98.7, platelets 193  I discussed with the patient that his hemoglobin is fairly reasonable and does not appear to be of any concern.  I did not recommend doing any additional investigations unless the hemoglobin drops below 10 g.  For the thrombocytopenia I obtained a CBC and a blue top tube to prevent clumping.  We will rule out spurious thrombocytopenia before considering additional work-ups.  Blood count review 06/19/2017: Platelet count 155 and hemoglobin of 12.1 10 years ago patient had a hemoglobin of 12.3. I believe that the low platelets being found previously were related to platelet clumping.  Since the platelet counts are fairly reasonable, there is no need for immediate treatment.  I recommended watchful monitoring.  Please do not hesitate to contact us if his platelet counts drop below 100 or his hemoglobin drops below 10 g. Thank you very much for allowing [place] to participate in his care.   All questions were answered. The patient knows to call the clinic with any problems, questions or concerns.    Harriette Ohara, MD 06/19/17

## 2017-08-07 DIAGNOSIS — B349 Viral infection, unspecified: Secondary | ICD-10-CM | POA: Diagnosis not present

## 2017-08-14 DIAGNOSIS — R509 Fever, unspecified: Secondary | ICD-10-CM | POA: Diagnosis not present

## 2017-09-03 DIAGNOSIS — R9721 Rising PSA following treatment for malignant neoplasm of prostate: Secondary | ICD-10-CM | POA: Diagnosis not present

## 2017-09-13 DIAGNOSIS — C61 Malignant neoplasm of prostate: Secondary | ICD-10-CM | POA: Diagnosis not present

## 2017-09-14 ENCOUNTER — Ambulatory Visit (HOSPITAL_BASED_OUTPATIENT_CLINIC_OR_DEPARTMENT_OTHER): Admission: RE | Admit: 2017-09-14 | Payer: PPO | Source: Ambulatory Visit | Admitting: Urology

## 2017-09-14 ENCOUNTER — Encounter (HOSPITAL_BASED_OUTPATIENT_CLINIC_OR_DEPARTMENT_OTHER): Admission: RE | Payer: Self-pay | Source: Ambulatory Visit

## 2017-09-14 SURGERY — INCISION AND DRAINAGE, ABSCESS
Anesthesia: General | Laterality: Right

## 2017-10-14 DIAGNOSIS — R05 Cough: Secondary | ICD-10-CM | POA: Diagnosis not present

## 2017-10-14 DIAGNOSIS — B349 Viral infection, unspecified: Secondary | ICD-10-CM | POA: Diagnosis not present

## 2017-10-19 DIAGNOSIS — M109 Gout, unspecified: Secondary | ICD-10-CM | POA: Diagnosis not present

## 2017-10-30 DIAGNOSIS — C61 Malignant neoplasm of prostate: Secondary | ICD-10-CM | POA: Diagnosis not present

## 2017-11-06 DIAGNOSIS — C61 Malignant neoplasm of prostate: Secondary | ICD-10-CM | POA: Diagnosis not present

## 2018-01-09 DIAGNOSIS — C61 Malignant neoplasm of prostate: Secondary | ICD-10-CM | POA: Diagnosis not present

## 2018-01-15 DIAGNOSIS — C61 Malignant neoplasm of prostate: Secondary | ICD-10-CM | POA: Diagnosis not present

## 2018-04-02 DIAGNOSIS — I10 Essential (primary) hypertension: Secondary | ICD-10-CM | POA: Diagnosis not present

## 2018-05-02 DIAGNOSIS — M19032 Primary osteoarthritis, left wrist: Secondary | ICD-10-CM | POA: Diagnosis not present

## 2018-05-09 DIAGNOSIS — N183 Chronic kidney disease, stage 3 (moderate): Secondary | ICD-10-CM | POA: Diagnosis not present

## 2018-05-09 DIAGNOSIS — E7800 Pure hypercholesterolemia, unspecified: Secondary | ICD-10-CM | POA: Diagnosis not present

## 2018-05-14 DIAGNOSIS — C61 Malignant neoplasm of prostate: Secondary | ICD-10-CM | POA: Diagnosis not present

## 2018-05-16 DIAGNOSIS — D649 Anemia, unspecified: Secondary | ICD-10-CM | POA: Diagnosis not present

## 2018-05-16 DIAGNOSIS — S61209A Unspecified open wound of unspecified finger without damage to nail, initial encounter: Secondary | ICD-10-CM | POA: Diagnosis not present

## 2018-05-16 DIAGNOSIS — M810 Age-related osteoporosis without current pathological fracture: Secondary | ICD-10-CM | POA: Diagnosis not present

## 2018-05-16 DIAGNOSIS — F0390 Unspecified dementia without behavioral disturbance: Secondary | ICD-10-CM | POA: Diagnosis not present

## 2018-05-16 DIAGNOSIS — S62638A Displaced fracture of distal phalanx of other finger, initial encounter for closed fracture: Secondary | ICD-10-CM | POA: Diagnosis not present

## 2018-05-16 DIAGNOSIS — R188 Other ascites: Secondary | ICD-10-CM | POA: Diagnosis not present

## 2018-05-16 DIAGNOSIS — Z6827 Body mass index (BMI) 27.0-27.9, adult: Secondary | ICD-10-CM | POA: Diagnosis not present

## 2018-05-16 DIAGNOSIS — Z23 Encounter for immunization: Secondary | ICD-10-CM | POA: Diagnosis not present

## 2018-05-16 DIAGNOSIS — N189 Chronic kidney disease, unspecified: Secondary | ICD-10-CM | POA: Diagnosis not present

## 2018-05-16 DIAGNOSIS — Z1389 Encounter for screening for other disorder: Secondary | ICD-10-CM | POA: Diagnosis not present

## 2018-05-16 DIAGNOSIS — N183 Chronic kidney disease, stage 3 (moderate): Secondary | ICD-10-CM | POA: Diagnosis not present

## 2018-05-16 DIAGNOSIS — S6992XD Unspecified injury of left wrist, hand and finger(s), subsequent encounter: Secondary | ICD-10-CM | POA: Diagnosis not present

## 2018-05-16 DIAGNOSIS — M25552 Pain in left hip: Secondary | ICD-10-CM | POA: Diagnosis not present

## 2018-05-16 DIAGNOSIS — F411 Generalized anxiety disorder: Secondary | ICD-10-CM | POA: Diagnosis not present

## 2018-05-16 DIAGNOSIS — Z79899 Other long term (current) drug therapy: Secondary | ICD-10-CM | POA: Diagnosis not present

## 2018-05-16 DIAGNOSIS — Z932 Ileostomy status: Secondary | ICD-10-CM | POA: Diagnosis not present

## 2018-05-16 DIAGNOSIS — E7800 Pure hypercholesterolemia, unspecified: Secondary | ICD-10-CM | POA: Diagnosis not present

## 2018-05-16 DIAGNOSIS — Z888 Allergy status to other drugs, medicaments and biological substances status: Secondary | ICD-10-CM | POA: Diagnosis not present

## 2018-05-16 DIAGNOSIS — M069 Rheumatoid arthritis, unspecified: Secondary | ICD-10-CM | POA: Diagnosis not present

## 2018-05-16 DIAGNOSIS — I6529 Occlusion and stenosis of unspecified carotid artery: Secondary | ICD-10-CM | POA: Diagnosis not present

## 2018-05-16 DIAGNOSIS — R7309 Other abnormal glucose: Secondary | ICD-10-CM | POA: Diagnosis not present

## 2018-05-16 DIAGNOSIS — I10 Essential (primary) hypertension: Secondary | ICD-10-CM | POA: Diagnosis not present

## 2018-05-16 DIAGNOSIS — Z9049 Acquired absence of other specified parts of digestive tract: Secondary | ICD-10-CM | POA: Diagnosis not present

## 2018-05-16 DIAGNOSIS — Z0000 Encounter for general adult medical examination without abnormal findings: Secondary | ICD-10-CM | POA: Diagnosis not present

## 2018-05-16 DIAGNOSIS — C61 Malignant neoplasm of prostate: Secondary | ICD-10-CM | POA: Diagnosis not present

## 2018-05-16 DIAGNOSIS — N390 Urinary tract infection, site not specified: Secondary | ICD-10-CM | POA: Diagnosis not present

## 2018-05-16 DIAGNOSIS — I129 Hypertensive chronic kidney disease with stage 1 through stage 4 chronic kidney disease, or unspecified chronic kidney disease: Secondary | ICD-10-CM | POA: Diagnosis not present

## 2018-05-16 DIAGNOSIS — Z8719 Personal history of other diseases of the digestive system: Secondary | ICD-10-CM | POA: Diagnosis not present

## 2018-05-16 DIAGNOSIS — Z1211 Encounter for screening for malignant neoplasm of colon: Secondary | ICD-10-CM | POA: Diagnosis not present

## 2018-05-20 ENCOUNTER — Other Ambulatory Visit: Payer: Self-pay | Admitting: Family Medicine

## 2018-05-20 DIAGNOSIS — I6529 Occlusion and stenosis of unspecified carotid artery: Secondary | ICD-10-CM

## 2018-05-28 DIAGNOSIS — C61 Malignant neoplasm of prostate: Secondary | ICD-10-CM | POA: Diagnosis not present

## 2018-05-28 DIAGNOSIS — H353133 Nonexudative age-related macular degeneration, bilateral, advanced atrophic without subfoveal involvement: Secondary | ICD-10-CM | POA: Diagnosis not present

## 2018-05-28 DIAGNOSIS — H40003 Preglaucoma, unspecified, bilateral: Secondary | ICD-10-CM | POA: Diagnosis not present

## 2018-05-28 DIAGNOSIS — B356 Tinea cruris: Secondary | ICD-10-CM | POA: Diagnosis not present

## 2018-05-28 DIAGNOSIS — R9721 Rising PSA following treatment for malignant neoplasm of prostate: Secondary | ICD-10-CM | POA: Diagnosis not present

## 2018-05-28 DIAGNOSIS — K319 Disease of stomach and duodenum, unspecified: Secondary | ICD-10-CM | POA: Diagnosis not present

## 2018-07-02 ENCOUNTER — Other Ambulatory Visit: Payer: PPO

## 2018-07-03 ENCOUNTER — Encounter: Payer: Self-pay | Admitting: Gastroenterology

## 2018-07-11 ENCOUNTER — Other Ambulatory Visit: Payer: Self-pay

## 2018-07-11 ENCOUNTER — Ambulatory Visit (AMBULATORY_SURGERY_CENTER): Payer: Self-pay | Admitting: *Deleted

## 2018-07-11 ENCOUNTER — Encounter: Payer: Self-pay | Admitting: Gastroenterology

## 2018-07-11 VITALS — Ht 70.0 in | Wt 212.0 lb

## 2018-07-11 DIAGNOSIS — Z1211 Encounter for screening for malignant neoplasm of colon: Secondary | ICD-10-CM

## 2018-07-11 MED ORDER — PEG 3350-KCL-NA BICARB-NACL 420 G PO SOLR: 4000.0000 mL | Freq: Once | ORAL | 0 refills | Status: AC

## 2018-07-11 NOTE — Progress Notes (Signed)
No egg or soy allergy known to patient  No issues with past sedation with any surgeries  or procedures, no intubation problems  No diet pills per patient No home 02 use per patient  No blood thinners per patient  Pt denies issues with constipation  No A fib or A flutter  EMMI video information given

## 2018-07-15 ENCOUNTER — Ambulatory Visit
Admission: RE | Admit: 2018-07-15 | Discharge: 2018-07-15 | Disposition: A | Discharge status: Home / Self Care | Payer: PPO | Source: Ambulatory Visit | Attending: Family Medicine | Admitting: Family Medicine

## 2018-07-15 DIAGNOSIS — I6523 Occlusion and stenosis of bilateral carotid arteries: Secondary | ICD-10-CM | POA: Diagnosis not present

## 2018-07-15 DIAGNOSIS — I6529 Occlusion and stenosis of unspecified carotid artery: Secondary | ICD-10-CM

## 2018-07-24 ENCOUNTER — Telehealth: Payer: Self-pay | Admitting: *Deleted

## 2018-07-24 NOTE — Telephone Encounter (Signed)

## 2018-07-25 ENCOUNTER — Ambulatory Visit (AMBULATORY_SURGERY_CENTER): Payer: PPO | Admitting: Gastroenterology

## 2018-07-25 ENCOUNTER — Other Ambulatory Visit: Payer: Self-pay

## 2018-07-25 ENCOUNTER — Encounter: Payer: Self-pay | Admitting: Gastroenterology

## 2018-07-25 ENCOUNTER — Telehealth: Payer: Self-pay | Admitting: Gastroenterology

## 2018-07-25 VITALS — BP 128/76 | HR 62 | Temp 98.5°F | Resp 15 | Ht 70.0 in | Wt 212.0 lb

## 2018-07-25 DIAGNOSIS — Z538 Procedure and treatment not carried out for other reasons: Secondary | ICD-10-CM

## 2018-07-25 DIAGNOSIS — Z1211 Encounter for screening for malignant neoplasm of colon: Secondary | ICD-10-CM

## 2018-07-25 MED ORDER — SODIUM CHLORIDE 0.9 % IV SOLN: 500.0000 mL | Freq: Once | INTRAVENOUS | Status: DC

## 2018-07-25 NOTE — Op Note (Signed)
[facility] Patient Name: [name] Procedure Date: 07/25/2018 9:07 AM MRN: [id number] Endoscopist: Mallie Mussel L. Loletha Carrow , MD Age: 73 Referring MD:  Date of Birth: 28-Jan-1946 Gender: Male Account #: [id number] Procedure:                Colonoscopy Indications:              Screening for colorectal malignant neoplasm                            (patient reported last screening colonoscopy                            approximately 20 years prior) Medicines:                Monitored Anesthesia Care Procedure:                Pre-Anesthesia Assessment:                           - Prior to the procedure, a History and Physical                            was performed, and patient medications and                            allergies were reviewed. The patient's tolerance of                            previous anesthesia was also reviewed. The risks                            and benefits of the procedure and the sedation                            options and risks were discussed with the patient.                            All questions were answered, and informed consent                            was obtained. Prior Anticoagulants: The patient has                            taken no previous anticoagulant or antiplatelet                            agents except for aspirin. ASA Grade Assessment: II                            - A patient with mild systemic disease. After                            reviewing the risks and benefits, the patient was  deemed in satisfactory condition to undergo the                            procedure.                           After obtaining informed consent, the colonoscope                            was passed under direct vision. Throughout the                            procedure, the patient's blood pressure, pulse, and                            oxygen saturations were monitored continuously. The    Colonoscope was introduced through the anus with                            the intention of advancing to the cecum. The scope                            was advanced to the sigmoid colon before the                            procedure was aborted (see below). Medications were                            given. The colonoscopy was performed with                            difficulty due to a redundant colon. The patient                            tolerated the procedure well. The quality of the                            bowel preparation was good. Scope In: 9:24:41 AM Scope Out: 9:28:55 AM Total Procedure Duration: 0 hours 4 minutes 14 seconds  Findings:                 The perianal and digital rectal examinations were                            normal.                           The sigmoid colon was redundant, causing scope                            looping. The patient was then examined further and                            found to have a suspected inguional hernia, with  large/soft scrotum and scope light seen within it.                            The scope was slowly withdrawn and air suctioned. Complications:            No immediate complications. Estimated Blood Loss:     Estimated blood loss: none. Impression:               - Redundant colon.                           - No specimens collected. Recommendation:           - Patient has a contact number available for                            emergencies. The signs and symptoms of potential                            delayed complications were discussed with the                            patient. Return to normal activities tomorrow.                            Written discharge instructions were provided to the                            patient.                           - Resume previous diet.                           - Continue present medications.                           - Refer to general  surgery for hernia repair.                           Repeat screening colonoscopy after hernia repair                            and recovery. [name] L. Loletha Carrow, MD 07/25/2018 9:38:13 AM This report has been signed electronically.

## 2018-07-25 NOTE — Patient Instructions (Addendum)
Thank you for allowing [place] to care for you today!  Resume your previous diet and medications today.  Return to your  normal activities tomorrow.  Refer to general surgery for hernia repair.  Repeat colonoscopy after hernia repair and recovery.     YOU HAD AN ENDOSCOPIC PROCEDURE TODAY AT [facility]:   Refer to the procedure report that was given to you for any specific questions about what was found during the examination.  If the procedure report does not answer your questions, please call your gastroenterologist to clarify.  If you requested that your care partner not be given the details of your procedure findings, then the procedure report has been included in a sealed envelope for you to review at your convenience later.  YOU SHOULD EXPECT: Some feelings of bloating in the abdomen. Passage of more gas than usual.  Walking can help get rid of the air that was put into your GI tract during the procedure and reduce the bloating. If you had a lower endoscopy (such as a colonoscopy or flexible sigmoidoscopy) you may notice spotting of blood in your stool or on the toilet paper. If you underwent a bowel prep for your procedure, you may not have a normal bowel movement for a few days.  Please Note:  You might notice some irritation and congestion in your nose or some drainage.  This is from the oxygen used during your procedure.  There is no need for concern and it should clear up in a day or so.  SYMPTOMS TO REPORT IMMEDIATELY:   Following lower endoscopy (colonoscopy or flexible sigmoidoscopy):  Excessive amounts of blood in the stool  Significant tenderness or worsening of abdominal pains  Swelling of the abdomen that is new, acute  Fever of 100F or higher   For urgent or emergent issues, a gastroenterologist can be reached at any hour by calling [phone].   DIET:  We do recommend a small meal at first, but then you may proceed to your regular diet.  Drink  plenty of fluids but you should avoid alcoholic beverages for 24 hours.  ACTIVITY:  You should plan to take it easy for the rest of today and you should NOT DRIVE or use heavy machinery until tomorrow (because of the sedation medicines used during the test).    FOLLOW UP: Our staff will call the number listed on your records 48-72 hours following your procedure to check on you and address any questions or concerns that you may have regarding the information given to you following your procedure. If we do not reach you, we will leave a message.  We will attempt to reach you two times.  During this call, we will ask if you have developed any symptoms of COVID 19. If you develop any symptoms (ie: fever, flu-like symptoms, shortness of breath, cough etc.) before then, please call [phone].  If you test positive for Covid 19 in the 2 weeks post procedure, please call and report this information to [place].    If any biopsies were taken you will be contacted by phone or by letter within the next 1-3 weeks.  Please call us at [phone] if you have not heard about the biopsies in 3 weeks.    SIGNATURES/CONFIDENTIALITY: You and/or your care partner have signed paperwork which will be entered into your electronic medical record.  These signatures attest to the fact that that the information above on your After Visit Summary has been reviewed  and is understood.  Full responsibility of the confidentiality of this discharge information lies with you and/or your care-partner. 

## 2018-07-25 NOTE — Progress Notes (Signed)
To PACU, VSS. Report to [email or web address] 

## 2018-07-25 NOTE — Progress Notes (Signed)
Pt's states no medical or surgical changes since previsit or office visit.  Temps taken by CW V/S taken by JB

## 2018-07-25 NOTE — Telephone Encounter (Signed)
Faxed patient information, along with colonoscopy report to [facility] [phone].

## 2018-07-25 NOTE — Progress Notes (Signed)
Patient examined in post-procedure area and was feeling well, without abdominal or scrotal pain.   He examined the hernia and stated it was unchanged from how it has been over about the last 10 years (at least as far back as his prostate cancer surgery).  Agreeable to surgical referral for hernia repair, after which he will need a repeat screening colonoscopy.

## 2018-07-25 NOTE — Telephone Encounter (Signed)
Please send a referral to [facility] surgery on this patient for inguinal hernia. Send my colonoscopy report with the referral.

## 2018-07-29 ENCOUNTER — Telehealth: Payer: Self-pay | Admitting: *Deleted

## 2018-07-29 NOTE — Telephone Encounter (Signed)
  Follow up Call-  Call back number 07/25/2018  Post procedure Call Back phone  # [phone]  Permission to leave phone message Yes  Some recent data might be hidden     Patient questions:  Do you have a fever, pain , or abdominal swelling? No. Pain Score  0 *  Have you tolerated food without any problems? Yes.    Have you been able to return to your normal activities? Yes.    Do you have any questions about your discharge instructions: Diet   No. Medications  No. Follow up visit  No.  Do you have questions or concerns about your Care? No.  Actions: * If pain score is 4 or above: No action needed, pain <4.  Have you developed a fever since your procedure? NO  2.   Have you had an respiratory symptoms (SOB or cough) since your procedure? NO  3.   Have you tested positive for COVID 19 since your procedure NO  4.   Have you had any family members/close contacts diagnosed with the COVID 19 since your procedure?     If yes to any of these questions please route to Joylene John, RN and Alphonsa Gin, RN.

## 2018-08-09 DIAGNOSIS — I10 Essential (primary) hypertension: Secondary | ICD-10-CM | POA: Diagnosis not present

## 2018-08-09 DIAGNOSIS — R7989 Other specified abnormal findings of blood chemistry: Secondary | ICD-10-CM | POA: Diagnosis not present

## 2018-08-28 ENCOUNTER — Ambulatory Visit: Payer: Self-pay | Admitting: Surgery

## 2018-08-28 DIAGNOSIS — K429 Umbilical hernia without obstruction or gangrene: Secondary | ICD-10-CM | POA: Diagnosis not present

## 2018-08-28 DIAGNOSIS — K4020 Bilateral inguinal hernia, without obstruction or gangrene, not specified as recurrent: Secondary | ICD-10-CM | POA: Diagnosis not present

## 2018-08-28 NOTE — H&P (Signed)
Surgical H&P  CC: inguinal hernia  HPI: This is a very pleasant and relatively healthy 73 year old man who is referred for evaluation of a large chronically incarcerated inguinal hernia.  He states that this is been present for many years- predates his laparoscopic total prostatectomy 11 years ago- and really has never caused him any problems.  Denies any pain, constipation, or obstructive type symptoms. When he first noticed that it was reducible but it has been chronically incarcerated for quite some time now. He underwent an attempt at screening colonoscopy and apparently this was unable to be completed due to the anatomy related to the hernia.  Subsequently he's been referred for discussion of repair.  No Known Allergies  Past Medical History:  Diagnosis Date  . Cancer [facility])    prostate  . Gout   . Hyperlipidemia   . Hypertension     Past Surgical History:  Procedure Laterality Date  . COLONOSCOPY    . PROSTATE SURGERY      Family History  Problem Relation Age of Onset  . Colon polyps Neg Hx   . Colon cancer Neg Hx   . Esophageal cancer Neg Hx   . Rectal cancer Neg Hx   . Stomach cancer Neg Hx     Social History   Socioeconomic History  . Marital status: Married    Spouse name: Not on file  . Number of children: Not on file  . Years of education: Not on file  . Highest education level: Not on file  Occupational History  . Not on file  Social Needs  . Financial resource strain: Not on file  . Food insecurity    Worry: Not on file    Inability: Not on file  . Transportation needs    Medical: Not on file    Non-medical: Not on file  Tobacco Use  . Smoking status: Former Smoker    Quit date: 02/13/1998    Years since quitting: 20.5  . Smokeless tobacco: Never Used  Substance and Sexual Activity  . Alcohol use: No  . Drug use: No  . Sexual activity: Not on file  Lifestyle  . Physical activity    Days per week: Not on file    Minutes per session: Not on file   . Stress: Not on file  Relationships  . Social Herbalist on phone: Not on file    Gets together: Not on file    Attends religious service: Not on file    Active member of club or organization: Not on file    Attends meetings of clubs or organizations: Not on file    Relationship status: Not on file  Other Topics Concern  . Not on file  Social History Narrative  . Not on file    Current Outpatient Medications on File Prior to Visit  Medication Sig Dispense Refill  . aspirin 81 MG chewable tablet Chew by mouth daily.    Marland Kitchen atorvastatin (LIPITOR) 20 MG tablet Take 20 mg by mouth daily.    Marland Kitchen HYDROCHLOROTHIAZIDE PO Take 1 tablet by mouth daily.    Marland Kitchen losartan-hydrochlorothiazide (HYZAAR) 100-25 MG tablet Take 1 tablet by mouth daily.     No current facility-administered medications on file prior to visit.     Review of Systems: a complete, 10pt review of systems was completed with pertinent positives and negatives as documented in the HPI  Physical Exam: There were no vitals filed for this visit. Gen: alert and  well appearing Eye: extraocular motion intact, no scleral icterus ENT: moist mucus membranes, dentition intact Neck: no mass or thyromegaly Chest: unlabored respirations, symmetrical air entry, clear bilaterally CV: regular rate and rhythm, no pedal edema Abdomen: soft, nontender, nondistended. No mass or organomegaly.  Multiple well-healed laparoscopic scars.  Small umbilical hernia.  Moderate chronically incarcerated right inguinal hernia.  Large chronically incarcerated left inguinal hernia. MSK: strength symmetrical throughout, no deformity Neuro: grossly intact, normal gait Psych: normal mood and affect, appropriate insight Skin: warm and dry, no rash or lesion on limited exam    CBC Latest Ref Rng & Units 06/19/2017 11/08/2006 11/02/2006  WBC 4.0 - 10.3 K/uL 6.5 10.9(H) -  Hemoglobin 13.0 - 17.1 g/dL 12.1(L) 12.3(L) 14.2  Hematocrit 38.4 - 49.9 % 36.7(L)  36.1(L) 41.3  Platelets 140 - 400 K/uL 155 182 -    CMP 11/08/2006 11/02/2006  Glucose 115(H) 108(H)  BUN 5(L) 9  Creatinine 1.00 0.89  Sodium 139 136  Potassium 3.8 3.4(L)  Chloride 105 103  CO2 29 25  Calcium 8.8 9.5    No results found for: INR, PROTIME  Imaging: No results found.    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 We discussed the options of open them up or scopic repair. I advised him that typically for bilateral hernia I recommended a laparoscopic approach however he has had a total prostatectomy and so the preperitoneal planes will be scarred and this will significantly increase the complexity of left scopic hernia repair. I recommended proceeding with an open approach. We discussed repairing both hernias at same time versus repairing the left only. He would like to address the left side only. We discussed the surgery including technical details, risk of bleeding, infection, pain, scarring, injury to bowel, bladder, vessels to the testicle, vas, or nerves that can cause chronic pain. Discussed risk of hematoma, seroma, and hernia recurrence. Also discussed need for general anesthesia with risks of cardiopulmonary or vascular complications. Questions were answered. He would like to go ahead and proceed with surgery hopefully towards the end of August.   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 No intervention required or planned at this point.  Romana Juniper, MD [facility] Surgery, [place] Pager [phone]

## 2018-09-26 DIAGNOSIS — C61 Malignant neoplasm of prostate: Secondary | ICD-10-CM | POA: Diagnosis not present

## 2018-10-08 DIAGNOSIS — C61 Malignant neoplasm of prostate: Secondary | ICD-10-CM | POA: Diagnosis not present

## 2018-10-15 NOTE — Progress Notes (Signed)
05/31/2017- noted in Epic- NM bone scan whole body

## 2018-10-15 NOTE — Patient Instructions (Addendum)
DUE TO COVID-19 ONLY ONE VISITOR IS ALLOWED TO COME WITH YOU AND STAY IN THE WAITING ROOM ONLY DURING PRE OP AND PROCEDURE DAY OF SURGERY. THE 1 VISITOR MAY VISIT WITH YOU AFTER SURGERY IN YOUR PRIVATE ROOM DURING VISITING HOURS ONLY!  YOU NEED TO HAVE A COVID 19 TEST ON____Saturday 09/05/2020___ @__10  am_____, THIS TEST MUST BE DONE BEFORE SURGERY, COME  [address] , [id number].  ([place]) ONCE YOUR COVID TEST IS COMPLETED, PLEASE BEGIN THE QUARANTINE INSTRUCTIONS AS OUTLINED IN YOUR HANDOUT.                [name]  10/15/2018   Your procedure is scheduled on: Wednesday 10/23/2018   Report to [facility] Main  Entrance              Report to Short Stay at [place]  AM     Call this number if you have problems the morning of surgery [phone]    Remember: Do not eat food or drink liquids :After Midnight.                           BRUSH YOUR TEETH MORNING OF SURGERY AND RINSE YOUR MOUTH OUT, NO CHEWING GUM CANDY OR MINTS.     Take these medicines the morning of surgery with A SIP OF WATER: Atorvastatin (Lipitor)                                 You may not have any metal on your body including hair pins and              piercings  Do not wear jewelry, make-up, lotions, powders or perfumes, deodorant                       Men may shave face and neck.   Do not bring valuables to the hospital. [place].  Contacts, dentures or bridgework may not be worn into surgery.  Leave suitcase in the car. After surgery it may be brought to your room.     Patients discharged the day of surgery will not be allowed to drive home. IF YOU ARE HAVING SURGERY AND GOING HOME THE SAME DAY, YOU MUST HAVE AN ADULT TO DRIVE YOU HOME AND BE WITH YOU FOR 24 HOURS. YOU MAY GO HOME BY TAXI OR UBER OR ORTHERWISE, BUT AN ADULT MUST ACCOMPANY YOU HOME AND STAY WITH YOU FOR 24 HOURS.  Name and phone number of your  driver: spouseMariann Craig [phone]                Please read over the following fact sheets you were given: _____________________________________________________________________             [name] - Preparing for Surgery Before surgery, you can play an important role.  Because skin is not sterile, your skin needs to be as free of germs as possible.  You can reduce the number of germs on your skin by washing with CHG (chlorahexidine gluconate) soap before surgery.  CHG is an antiseptic cleaner which kills germs and bonds with the skin to continue killing germs even after washing. Please DO NOT use if you have an allergy to CHG or  antibacterial soaps.  If your skin becomes reddened/irritated stop using the CHG and inform your nurse when you arrive at Short Stay. Do not shave (including legs and underarms) for at least 48 hours prior to the first CHG shower.  You may shave your face/neck. Please follow these instructions carefully:  1.  Shower with CHG Soap the night before surgery and the  morning of Surgery.  2.  If you choose to wash your hair, wash your hair first as usual with your  normal  shampoo.  3.  After you shampoo, rinse your hair and body thoroughly to remove the  shampoo.                           4.  Use CHG as you would any other liquid soap.  You can apply chg directly  to the skin and wash                       Gently with a scrungie or clean washcloth.  5.  Apply the CHG Soap to your body ONLY FROM THE NECK DOWN.   Do not use on face/ open                           Wound or open sores. Avoid contact with eyes, ears mouth and genitals (private parts).                       Wash face,  Genitals (private parts) with your normal soap.             6.  Wash thoroughly, paying special attention to the area where your surgery  will be performed.  7.  Thoroughly rinse your body with warm water from the neck down.  8.  DO NOT shower/wash with your normal soap after using and  rinsing off  the CHG Soap.                9.  Pat yourself dry with a clean towel.            10.  Wear clean pajamas.            11.  Place clean sheets on your bed the night of your first shower and do not  sleep with pets. Day of Surgery : Do not apply any lotions/deodorants the morning of surgery.  Please wear clean clothes to the hospital/surgery center.  FAILURE TO FOLLOW THESE INSTRUCTIONS MAY RESULT IN THE CANCELLATION OF YOUR SURGERY PATIENT SIGNATURE_________________________________  NURSE SIGNATURE__________________________________  ________________________________________________________________________

## 2018-10-17 ENCOUNTER — Other Ambulatory Visit: Payer: Self-pay

## 2018-10-17 ENCOUNTER — Encounter (HOSPITAL_COMMUNITY)
Admission: RE | Admit: 2018-10-17 | Discharge: 2018-10-17 | Disposition: A | Discharge status: Home / Self Care | Payer: PPO | Source: Ambulatory Visit | Attending: Surgery | Admitting: Surgery

## 2018-10-17 ENCOUNTER — Encounter (HOSPITAL_COMMUNITY): Payer: Self-pay

## 2018-10-17 DIAGNOSIS — Z01812 Encounter for preprocedural laboratory examination: Secondary | ICD-10-CM | POA: Diagnosis not present

## 2018-10-17 DIAGNOSIS — Z20828 Contact with and (suspected) exposure to other viral communicable diseases: Secondary | ICD-10-CM | POA: Insufficient documentation

## 2018-10-17 DIAGNOSIS — I10 Essential (primary) hypertension: Secondary | ICD-10-CM | POA: Insufficient documentation

## 2018-10-17 LAB — CBC WITH DIFFERENTIAL/PLATELET
Abs Immature Granulocytes: 0.02 10*3/uL (ref 0.00–0.07)
Basophils Absolute: 0 10*3/uL (ref 0.0–0.1)
Basophils Relative: 0 %
Eosinophils Absolute: 0.2 10*3/uL (ref 0.0–0.5)
Eosinophils Relative: 3 %
HCT: 33.1 % — ABNORMAL LOW (ref 39.0–52.0)
Hemoglobin: 10.4 g/dL — ABNORMAL LOW (ref 13.0–17.0)
Immature Granulocytes: 0 %
Lymphocytes Relative: 41 %
Lymphs Abs: 3.1 10*3/uL (ref 0.7–4.0)
MCH: 31.7 pg (ref 26.0–34.0)
MCHC: 31.4 g/dL (ref 30.0–36.0)
MCV: 100.9 fL — ABNORMAL HIGH (ref 80.0–100.0)
Monocytes Absolute: 0.8 10*3/uL (ref 0.1–1.0)
Monocytes Relative: 11 %
Neutro Abs: 3.3 10*3/uL (ref 1.7–7.7)
Neutrophils Relative %: 45 %
Platelets: 186 10*3/uL (ref 150–400)
RBC: 3.28 MIL/uL — ABNORMAL LOW (ref 4.22–5.81)
RDW: 13.3 % (ref 11.5–15.5)
WBC: 7.5 10*3/uL (ref 4.0–10.5)
nRBC: 0 % (ref 0.0–0.2)

## 2018-10-17 LAB — BASIC METABOLIC PANEL
Anion gap: 10 (ref 5–15)
BUN: 25 mg/dL — ABNORMAL HIGH (ref 8–23)
CO2: 25 mmol/L (ref 22–32)
Calcium: 10.1 mg/dL (ref 8.9–10.3)
Chloride: 103 mmol/L (ref 98–111)
Creatinine, Ser: 1.6 mg/dL — ABNORMAL HIGH (ref 0.61–1.24)
GFR calc Af Amer: 49 mL/min — ABNORMAL LOW (ref >60)
GFR calc non Af Amer: 42 mL/min — ABNORMAL LOW (ref >60)
Glucose, Bld: 97 mg/dL (ref 70–99)
Potassium: 3.9 mmol/L (ref 3.5–5.1)
Sodium: 138 mmol/L (ref 135–145)

## 2018-10-17 NOTE — Progress Notes (Addendum)
PCP - Dr. Leighton Ruff Cardiologist - N/A  Chest x-ray -N/A  EKG - N/A Stress Test - N/A ECHO - N/A Cardiac Cath N/A   Sleep Study -N/A  CPAP - N/A  Fasting Blood Sugar - N/A Checks Blood Sugar _____ times a day  Blood Thinner Instructions:  Aspirin Instructions:Takes Aspirin from recommendation from Dr. Drema Dallas for his age. Instructed patient to call Dr. Drema Dallas to get instructions for when to stop his aspirin.  Last Dose:  Anesthesia review:  Chart given to Ward Givens to review lab-BMP (Creatinine-1.60)  Patient denies shortness of breath, fever, cough and chest pain at PAT appointment   Patient verbalized understanding of instructions that were given to them at the PAT appointment. Patient was also instructed that they will need to review over the PAT instructions again at home before surgery.

## 2018-10-19 ENCOUNTER — Other Ambulatory Visit (HOSPITAL_COMMUNITY)
Admission: RE | Admit: 2018-10-19 | Discharge: 2018-10-19 | Disposition: A | Discharge status: Home / Self Care | Payer: PPO | Source: Ambulatory Visit | Attending: Surgery | Admitting: Surgery

## 2018-10-19 DIAGNOSIS — Z01812 Encounter for preprocedural laboratory examination: Secondary | ICD-10-CM | POA: Diagnosis not present

## 2018-10-20 LAB — NOVEL CORONAVIRUS, NAA (HOSP ORDER, SEND-OUT TO REF LAB; TAT 18-24 HRS): SARS-CoV-2, NAA: NOT DETECTED

## 2018-10-22 MED ORDER — BUPIVACAINE LIPOSOME 1.3 % IJ SUSP
20.0000 mL | INTRAMUSCULAR | Status: AC
  Administered 2018-10-23: 10 mL
  Filled 2018-10-22: qty 20

## 2018-10-22 NOTE — Anesthesia Preprocedure Evaluation (Addendum)
Anesthesia Evaluation  Patient identified by MRN, date of birth, ID band Patient awake    Reviewed: Allergy & Precautions, NPO status , Patient's Chart, lab work & pertinent test results  History of Anesthesia Complications Negative for: history of anesthetic complications  Airway Mallampati: II  TM Distance: >3 FB Neck ROM: Full    Dental  (+) Edentulous Upper, Partial Lower, Dental Advisory Given   Pulmonary former smoker,    Pulmonary exam normal        Cardiovascular hypertension, Pt. on medications Normal cardiovascular exam     Neuro/Psych negative neurological ROS     GI/Hepatic negative GI ROS, Neg liver ROS,   Endo/Other  negative endocrine ROS  Renal/GU negative Renal ROS     Musculoskeletal negative musculoskeletal ROS (+)   Abdominal   Peds  Hematology negative hematology ROS (+)   Anesthesia Other Findings Day of surgery medications reviewed with the patient.  Reproductive/Obstetrics                            Anesthesia Physical Anesthesia Plan  ASA: II  Anesthesia Plan: General   Post-op Pain Management:    Induction: Intravenous  PONV Risk Score and Plan: 2 and Ondansetron and Dexamethasone  Airway Management Planned: Oral ETT  Additional Equipment:   Intra-op Plan:   Post-operative Plan: Extubation in OR  Informed Consent: I have reviewed the patients History and Physical, chart, labs and discussed the procedure including the risks, benefits and alternatives for the proposed anesthesia with the patient or authorized representative who has indicated his/her understanding and acceptance.     Dental advisory given  Plan Discussed with: CRNA and Anesthesiologist  Anesthesia Plan Comments:        Anesthesia Quick Evaluation

## 2018-10-23 ENCOUNTER — Encounter (HOSPITAL_COMMUNITY)
Admission: RE | Disposition: A | Discharge status: Home / Self Care | Payer: Self-pay | Source: Home / Self Care | Attending: Surgery

## 2018-10-23 ENCOUNTER — Ambulatory Visit (HOSPITAL_COMMUNITY): Payer: PPO | Admitting: Physician Assistant

## 2018-10-23 ENCOUNTER — Encounter (HOSPITAL_COMMUNITY): Payer: Self-pay | Admitting: Anesthesiology

## 2018-10-23 ENCOUNTER — Ambulatory Visit (HOSPITAL_COMMUNITY)
Admission: RE | Admit: 2018-10-23 | Discharge: 2018-10-23 | Disposition: A | Discharge status: Home / Self Care | Payer: PPO | Attending: Surgery | Admitting: Surgery

## 2018-10-23 ENCOUNTER — Ambulatory Visit (HOSPITAL_COMMUNITY): Payer: PPO | Admitting: Anesthesiology

## 2018-10-23 ENCOUNTER — Other Ambulatory Visit: Payer: Self-pay

## 2018-10-23 DIAGNOSIS — Z8546 Personal history of malignant neoplasm of prostate: Secondary | ICD-10-CM | POA: Insufficient documentation

## 2018-10-23 DIAGNOSIS — Z79899 Other long term (current) drug therapy: Secondary | ICD-10-CM | POA: Diagnosis not present

## 2018-10-23 DIAGNOSIS — Z87891 Personal history of nicotine dependence: Secondary | ICD-10-CM | POA: Diagnosis not present

## 2018-10-23 DIAGNOSIS — I6529 Occlusion and stenosis of unspecified carotid artery: Secondary | ICD-10-CM | POA: Diagnosis not present

## 2018-10-23 DIAGNOSIS — K429 Umbilical hernia without obstruction or gangrene: Secondary | ICD-10-CM | POA: Insufficient documentation

## 2018-10-23 DIAGNOSIS — Z7982 Long term (current) use of aspirin: Secondary | ICD-10-CM | POA: Insufficient documentation

## 2018-10-23 DIAGNOSIS — E785 Hyperlipidemia, unspecified: Secondary | ICD-10-CM | POA: Diagnosis not present

## 2018-10-23 DIAGNOSIS — K4000 Bilateral inguinal hernia, with obstruction, without gangrene, not specified as recurrent: Secondary | ICD-10-CM | POA: Insufficient documentation

## 2018-10-23 DIAGNOSIS — K4090 Unilateral inguinal hernia, without obstruction or gangrene, not specified as recurrent: Secondary | ICD-10-CM | POA: Diagnosis not present

## 2018-10-23 DIAGNOSIS — G8918 Other acute postprocedural pain: Secondary | ICD-10-CM | POA: Diagnosis not present

## 2018-10-23 DIAGNOSIS — K4030 Unilateral inguinal hernia, with obstruction, without gangrene, not specified as recurrent: Secondary | ICD-10-CM | POA: Diagnosis not present

## 2018-10-23 DIAGNOSIS — I10 Essential (primary) hypertension: Secondary | ICD-10-CM | POA: Diagnosis not present

## 2018-10-23 DIAGNOSIS — K660 Peritoneal adhesions (postprocedural) (postinfection): Secondary | ICD-10-CM | POA: Diagnosis not present

## 2018-10-23 HISTORY — PX: INGUINAL HERNIA REPAIR: SHX194

## 2018-10-23 SURGERY — REPAIR, HERNIA, INGUINAL, ADULT
Anesthesia: General | Site: Inguinal | Laterality: Left

## 2018-10-23 MED ORDER — GABAPENTIN 300 MG PO CAPS
300.0000 mg | ORAL_CAPSULE | ORAL | Status: AC
  Administered 2018-10-23: 300 mg via ORAL
  Filled 2018-10-23: qty 1

## 2018-10-23 MED ORDER — BUPIVACAINE HCL (PF) 0.25 % IJ SOLN
INTRAMUSCULAR | Status: DC | PRN
  Administered 2018-10-23: 20 mL

## 2018-10-23 MED ORDER — MIDAZOLAM HCL 2 MG/2ML IJ SOLN
INTRAMUSCULAR | Status: AC
  Filled 2018-10-23: qty 2

## 2018-10-23 MED ORDER — OXYCODONE HCL 5 MG PO TABS: 5.0000 mg | ORAL_TABLET | Freq: Three times a day (TID) | ORAL | 0 refills | Status: DC | PRN

## 2018-10-23 MED ORDER — ACETAMINOPHEN 650 MG RE SUPP
650.0000 mg | RECTAL | Status: DC | PRN
  Filled 2018-10-23: qty 1

## 2018-10-23 MED ORDER — FENTANYL CITRATE (PF) 100 MCG/2ML IJ SOLN: 25.0000 ug | INTRAMUSCULAR | Status: DC | PRN

## 2018-10-23 MED ORDER — DOCUSATE SODIUM 100 MG PO CAPS: 100.0000 mg | ORAL_CAPSULE | Freq: Two times a day (BID) | ORAL | 0 refills | Status: AC

## 2018-10-23 MED ORDER — 0.9 % SODIUM CHLORIDE (POUR BTL) OPTIME
TOPICAL | Status: DC | PRN
  Administered 2018-10-23: 10:00:00 1000 mL

## 2018-10-23 MED ORDER — LACTATED RINGERS IV SOLN
INTRAVENOUS | Status: DC
  Administered 2018-10-23 (×2): via INTRAVENOUS

## 2018-10-23 MED ORDER — PROPOFOL 10 MG/ML IV BOLUS
INTRAVENOUS | Status: DC | PRN
  Administered 2018-10-23: 160 mg via INTRAVENOUS
  Administered 2018-10-23: 20 mg via INTRAVENOUS

## 2018-10-23 MED ORDER — FENTANYL CITRATE (PF) 250 MCG/5ML IJ SOLN
INTRAMUSCULAR | Status: AC
  Filled 2018-10-23: qty 5

## 2018-10-23 MED ORDER — CHLORHEXIDINE GLUCONATE 4 % EX LIQD: 60.0000 mL | Freq: Once | CUTANEOUS | Status: DC

## 2018-10-23 MED ORDER — SODIUM CHLORIDE 0.9% FLUSH: 3.0000 mL | Freq: Two times a day (BID) | INTRAVENOUS | Status: DC

## 2018-10-23 MED ORDER — MIDAZOLAM HCL 5 MG/5ML IJ SOLN
INTRAMUSCULAR | Status: DC | PRN
  Administered 2018-10-23 (×2): 1 mg via INTRAVENOUS

## 2018-10-23 MED ORDER — BUPIVACAINE HCL (PF) 0.25 % IJ SOLN
INTRAMUSCULAR | Status: DC | PRN
  Administered 2018-10-23: 15 mL

## 2018-10-23 MED ORDER — PROMETHAZINE HCL 25 MG/ML IJ SOLN: 6.2500 mg | INTRAMUSCULAR | Status: DC | PRN

## 2018-10-23 MED ORDER — SODIUM CHLORIDE 0.9 % IV SOLN: 250.0000 mL | INTRAVENOUS | Status: DC | PRN

## 2018-10-23 MED ORDER — PROPOFOL 10 MG/ML IV BOLUS
INTRAVENOUS | Status: AC
  Filled 2018-10-23: qty 20

## 2018-10-23 MED ORDER — DEXAMETHASONE SODIUM PHOSPHATE 10 MG/ML IJ SOLN
INTRAMUSCULAR | Status: AC
  Filled 2018-10-23: qty 1

## 2018-10-23 MED ORDER — LIDOCAINE HCL (CARDIAC) PF 100 MG/5ML IV SOSY
PREFILLED_SYRINGE | INTRAVENOUS | Status: DC | PRN
  Administered 2018-10-23: 20 mg via INTRAVENOUS

## 2018-10-23 MED ORDER — DEXAMETHASONE SODIUM PHOSPHATE 10 MG/ML IJ SOLN
INTRAMUSCULAR | Status: DC | PRN
  Administered 2018-10-23: 5 mg via INTRAVENOUS

## 2018-10-23 MED ORDER — ROCURONIUM BROMIDE 10 MG/ML (PF) SYRINGE
PREFILLED_SYRINGE | INTRAVENOUS | Status: AC
  Filled 2018-10-23: qty 10

## 2018-10-23 MED ORDER — BUPIVACAINE HCL (PF) 0.25 % IJ SOLN
INTRAMUSCULAR | Status: AC
  Filled 2018-10-23: qty 30

## 2018-10-23 MED ORDER — SUGAMMADEX SODIUM 200 MG/2ML IV SOLN
INTRAVENOUS | Status: DC | PRN
  Administered 2018-10-23: 300 mg via INTRAVENOUS

## 2018-10-23 MED ORDER — FENTANYL CITRATE (PF) 100 MCG/2ML IJ SOLN
INTRAMUSCULAR | Status: DC | PRN
  Administered 2018-10-23: 50 ug via INTRAVENOUS
  Administered 2018-10-23 (×3): 25 ug via INTRAVENOUS
  Administered 2018-10-23 (×2): 50 ug via INTRAVENOUS
  Administered 2018-10-23: 25 ug via INTRAVENOUS

## 2018-10-23 MED ORDER — LIDOCAINE 2% (20 MG/ML) 5 ML SYRINGE
INTRAMUSCULAR | Status: AC
  Filled 2018-10-23: qty 5

## 2018-10-23 MED ORDER — ONDANSETRON HCL 4 MG/2ML IJ SOLN
INTRAMUSCULAR | Status: DC | PRN
  Administered 2018-10-23: 4 mg via INTRAVENOUS

## 2018-10-23 MED ORDER — ACETAMINOPHEN 325 MG PO TABS: 650.0000 mg | ORAL_TABLET | ORAL | Status: DC | PRN

## 2018-10-23 MED ORDER — SODIUM CHLORIDE 0.9% FLUSH: 3.0000 mL | INTRAVENOUS | Status: DC | PRN

## 2018-10-23 MED ORDER — ACETAMINOPHEN 500 MG PO TABS
1000.0000 mg | ORAL_TABLET | ORAL | Status: AC
  Administered 2018-10-23: 1000 mg via ORAL
  Filled 2018-10-23: qty 2

## 2018-10-23 MED ORDER — ONDANSETRON HCL 4 MG/2ML IJ SOLN
INTRAMUSCULAR | Status: AC
  Filled 2018-10-23: qty 2

## 2018-10-23 MED ORDER — ROCURONIUM BROMIDE 100 MG/10ML IV SOLN
INTRAVENOUS | Status: DC | PRN
  Administered 2018-10-23: 10 mg via INTRAVENOUS
  Administered 2018-10-23: 50 mg via INTRAVENOUS
  Administered 2018-10-23 (×2): 10 mg via INTRAVENOUS

## 2018-10-23 MED ORDER — CEFAZOLIN SODIUM-DEXTROSE 2-4 GM/100ML-% IV SOLN
2.0000 g | INTRAVENOUS | Status: AC
  Administered 2018-10-23: 2 g via INTRAVENOUS
  Filled 2018-10-23: qty 100

## 2018-10-23 MED ORDER — OXYCODONE HCL 5 MG PO TABS: 5.0000 mg | ORAL_TABLET | ORAL | Status: DC | PRN

## 2018-10-23 SURGICAL SUPPLY — 35 items
BENZOIN TINCTURE PRP APPL 2/3 (GAUZE/BANDAGES/DRESSINGS) ×2 IMPLANT
BLADE SURG 15 STRL LF DISP TIS (BLADE) ×1 IMPLANT
BLADE SURG 15 STRL SS (BLADE) ×1
CHLORAPREP W/TINT 26 (MISCELLANEOUS) ×2 IMPLANT
COVER SURGICAL LIGHT HANDLE (MISCELLANEOUS) ×2 IMPLANT
COVER WAND RF STERILE (DRAPES) IMPLANT
DECANTER SPIKE VIAL GLASS SM (MISCELLANEOUS) ×2 IMPLANT
DRAIN PENROSE 18X1/2 LTX STRL (DRAIN) ×2 IMPLANT
DRAPE LAPAROSCOPIC ABDOMINAL (DRAPES) ×2 IMPLANT
ELECT PENCIL ROCKER SW 15FT (MISCELLANEOUS) ×2 IMPLANT
ELECT REM PT RETURN 15FT ADLT (MISCELLANEOUS) ×2 IMPLANT
GAUZE SPONGE 4X4 12PLY STRL (GAUZE/BANDAGES/DRESSINGS) ×2 IMPLANT
GLOVE BIO SURGEON STRL SZ 6 (GLOVE) ×2 IMPLANT
GLOVE INDICATOR 6.5 STRL GRN (GLOVE) ×2 IMPLANT
GOWN STRL REUS W/TWL LRG LVL3 (GOWN DISPOSABLE) ×2 IMPLANT
GOWN STRL REUS W/TWL XL LVL3 (GOWN DISPOSABLE) ×2 IMPLANT
KIT BASIN OR (CUSTOM PROCEDURE TRAY) ×2 IMPLANT
KIT TURNOVER KIT A (KITS) IMPLANT
MESH ULTRAPRO 3X6 7.6X15CM (Mesh General) ×2 IMPLANT
NEEDLE HYPO 22GX1.5 SAFETY (NEEDLE) ×2 IMPLANT
PACK BASIC VI WITH GOWN DISP (CUSTOM PROCEDURE TRAY) ×2 IMPLANT
SPONGE LAP 4X18 RFD (DISPOSABLE) ×2 IMPLANT
STRIP CLOSURE SKIN 1/2X4 (GAUZE/BANDAGES/DRESSINGS) ×2 IMPLANT
SUT ETHIBOND 0 MO6 C/R (SUTURE) ×2 IMPLANT
SUT MNCRL AB 4-0 PS2 18 (SUTURE) ×2 IMPLANT
SUT SILK 3 0 (SUTURE) ×1
SUT SILK 3-0 18XBRD TIE 12 (SUTURE) ×1 IMPLANT
SUT VIC AB 3-0 SH 27 (SUTURE) ×2
SUT VIC AB 3-0 SH 27XBRD (SUTURE) ×2 IMPLANT
SYR CONTROL 10ML LL (SYRINGE) ×2 IMPLANT
TAPE CLOTH SURG 4X10 WHT LF (GAUZE/BANDAGES/DRESSINGS) ×2 IMPLANT
TOWEL OR 17X26 10 PK STRL BLUE (TOWEL DISPOSABLE) ×2 IMPLANT
TOWEL OR NON WOVEN STRL DISP B (DISPOSABLE) ×2 IMPLANT
TRAY FOLEY MTR SLVR 16FR STAT (SET/KITS/TRAYS/PACK) IMPLANT
YANKAUER SUCT BULB TIP 10FT TU (MISCELLANEOUS) IMPLANT

## 2018-10-23 NOTE — Anesthesia Procedure Notes (Signed)
Procedure Name: Intubation Date/Time: 10/23/2018 8:30 AM Performed by: Garrel Ridgel, CRNA Pre-anesthesia Checklist: Patient identified, Emergency Drugs available, Suction available, Patient being monitored and Timeout performed Patient Re-evaluated:Patient Re-evaluated prior to induction Oxygen Delivery Method: Circle system utilized Preoxygenation: Pre-oxygenation with 100% oxygen Induction Type: IV induction Ventilation: Mask ventilation without difficulty Laryngoscope Size: Mac and 4 Grade View: Grade I Tube type: Oral Tube size: 7.5 mm Number of attempts: 1 Airway Equipment and Method: Stylet Placement Confirmation: ETT inserted through vocal cords under direct vision and breath sounds checked- equal and bilateral Secured at: 22.5 cm Tube secured with: Tape Dental Injury: Teeth and Oropharynx as per pre-operative assessment

## 2018-10-23 NOTE — Discharge Instructions (Signed)
HERNIA REPAIR: POST OP INSTRUCTIONS  ######################################################################  EAT Gradually transition to a high fiber diet with a fiber supplement over the next few weeks after discharge.  Start with a pureed / full liquid diet (see below)  WALK Walk an hour a day.  Control your pain to do that.    CONTROL PAIN Control pain so that you can walk, sleep, tolerate sneezing/coughing, and go up/down stairs.  HAVE A BOWEL MOVEMENT DAILY Keep your bowels regular to avoid problems.  OK to try a laxative to override constipation.  OK to use an antidairrheal to slow down diarrhea.  Call if not better after 2 tries  CALL IF YOU HAVE PROBLEMS/CONCERNS Call if you are still struggling despite following these instructions. Call if you have concerns not answered by these instructions  ######################################################################    1. DIET: Follow a light bland diet & liquids the first 24 hours after arrival home, such as soup, liquids, starches, etc.  Be sure to drink plenty of fluids.  Quickly advance to a usual solid diet within a few days.  Avoid fast food or heavy meals as your are more likely to get nauseated or have irregular bowels.  A low-sugar, high-fiber diet for the rest of your life is ideal.   2. Take your usually prescribed home medications unless otherwise directed.  3. PAIN CONTROL: a. Pain is best controlled by a usual combination of three different methods TOGETHER: i. Ice/Heat ii. Over the counter pain medication iii. Prescription pain medication b. Most patients will experience some swelling and bruising around the hernia(s) such as the bellybutton, groins, or old incisions.  Ice packs or heating pads (30-60 minutes up to 6 times a day) will help. Use ice for the first few days to help decrease swelling and bruising, then switch to heat to help relax tight/sore spots and speed recovery.  Some people prefer to use ice  alone, heat alone, alternating between ice & heat.  Experiment to what works for you.  Swelling and bruising can take several weeks to resolve.   c. It is helpful to take an over-the-counter pain medication regularly for the first few weeks.  Choose one of the following that works best for you: i. Naproxen (Aleve, etc)  Two 220mg  tabs twice a day OR Ibuprofen (Advil, etc) Three 200mg  tabs four times a day (every meal & bedtime) AND ii. Acetaminophen (Tylenol, etc) 325-650mg  four times a day (every meal & bedtime) d. A  prescription for pain medication should be given to you upon discharge.  Take your pain medication as prescribed, if needed.  i. If you are having problems/concerns with the prescription medicine (does not control pain, nausea, vomiting, rash, itching, etc), please call us [phone] to see if we need to switch you to a different pain medicine that will work better for you and/or control your side effect better. ii. If you need a refill on your pain medication, please contact your pharmacy.  They will contact our office to request authorization. Prescriptions will not be filled after 5 pm or on week-ends.  4. Avoid getting constipated.  Between the surgery and the pain medications, it is common to experience some constipation.  Increasing fluid intake and taking a fiber supplement (such as Metamucil, Citrucel, FiberCon, MiraLax, etc) 1-2 times a day regularly will usually help prevent this problem from occurring.  A mild laxative (prune juice, Milk of Magnesia, MiraLax, etc) should be taken according to package directions if there are no bowel  movements after 48 hours.    5. Wash / shower every day, starting 2 days after surgery.  You may shower over the steri strips as they are waterproof.    6. Remove your outer bandage 2 days after surgery. Steri strips will peel off after 1-2 weeks. You may replace a dressing/Band-Aid to cover the incision for comfort if you wish. You may leave  the incisions open to air.  Continue to shower over incision(s) after the dressing is off.  7. ACTIVITIES as tolerated:   a. You may resume regular (light) daily activities beginning the next day--such as daily self-care, walking, climbing stairs--gradually increasing activities as tolerated.  Control your pain so that you can walk an hour a day.  If you can walk 30 minutes without difficulty, it is safe to try more intense activity such as jogging, treadmill, bicycling, low-impact aerobics, swimming, etc. b. Refrain from the most intensive and strenuous activity such as sit-ups, heavy lifting, contact sports, etc  Refrain from any heavy lifting or straining until 6 weeks after surgery.   c. DO NOT PUSH THROUGH PAIN.  Let pain be your guide: If it hurts to do something, don't do it.  Pain is your body warning you to avoid that activity for another week until the pain goes down. d. You may drive when you are no longer taking prescription pain medication, you can comfortably wear a seatbelt, and you can safely maneuver your car and apply brakes. e. [name] may have sexual intercourse when it is comfortable.   8. FOLLOW UP in our office a. Please call [facility] at [phone] to set up an appointment to see your surgeon in the office for a follow-up appointment approximately 2-3 weeks after your surgery. b. Make sure that you call for this appointment the day you arrive home to insure a convenient appointment time.  9.  If you have disability of FMLA / Family leave forms, please bring the forms to the office for processing.  (do not give to your surgeon).  WHEN TO CALL us [phone]: 1. Poor pain control 2. Reactions / problems with new medications (rash/itching, nausea, etc)  3. Fever over 101.5 F (38.5 C) 4. Inability to urinate 5. Nausea and/or vomiting 6. Worsening swelling or bruising 7. Continued bleeding from incision. 8. Increased pain, redness, or drainage from the incision   The clinic  staff is available to answer your questions during regular business hours (8:30am-5pm).  Please dont hesitate to call and ask to speak to one of our nurses for clinical concerns.   If you have a medical emergency, go to the nearest emergency room or call 911.  A surgeon from [facility] is always on call at the hospitals in [facility], [place], [place], [place], [place]  [address] ?  [address] [address], [place], [place]   [address] MAIN: [phone] ? TOLL FREE: [phone] ? FAX: [phone] [email or web address]    General Anesthesia, Adult, Care After This sheet gives you information about how to care for yourself after your procedure. Your health care provider may also give you more specific instructions. If you have problems or questions, contact your health care provider. What can I expect after the procedure? After the procedure, the following side effects are common:  Pain or discomfort at the IV site.  Nausea.  Vomiting.  Sore throat.  Trouble concentrating.  Feeling cold or chills.  Weak or tired.  Sleepiness and  fatigue.  Soreness and body aches. These side effects can affect parts of the body that were not involved in surgery. Follow these instructions at home:  For at least 24 hours after the procedure:  Have a responsible adult stay with you. It is important to have someone help care for you until you are awake and alert.  Rest as needed.  Do not: ? Participate in activities in which you could fall or become injured. ? Drive. ? Use heavy machinery. ? Drink alcohol. ? Take sleeping pills or medicines that cause drowsiness. ? Make important decisions or sign legal documents. ? Take care of children on your own. Eating and drinking  Follow any instructions from your health care provider about eating or drinking restrictions.  When you feel hungry, start by eating small amounts of foods that  are soft and easy to digest (bland), such as toast. Gradually return to your regular diet.  Drink enough fluid to keep your urine pale yellow.  If you vomit, rehydrate by drinking water, juice, or clear broth. General instructions  If you have sleep apnea, surgery and certain medicines can increase your risk for breathing problems. Follow instructions from your health care provider about wearing your sleep device: ? Anytime you are sleeping, including during daytime naps. ? While taking prescription pain medicines, sleeping medicines, or medicines that make you drowsy.  Return to your normal activities as told by your health care provider. Ask your health care provider what activities are safe for you.  Take over-the-counter and prescription medicines only as told by your health care provider.  If you smoke, do not smoke without supervision.  Keep all follow-up visits as told by your health care provider. This is important. Contact a health care provider if:  You have nausea or vomiting that does not get better with medicine.  You cannot eat or drink without vomiting.  You have pain that does not get better with medicine.  You are unable to pass urine.  You develop a skin rash.  You have a fever.  You have redness around your IV site that gets worse. Get help right away if:  You have difficulty breathing.  You have chest pain.  You have blood in your urine or stool, or you vomit blood. Summary  After the procedure, it is common to have a sore throat or nausea. It is also common to feel tired.  Have a responsible adult stay with you for the first 24 hours after general anesthesia. It is important to have someone help care for you until you are awake and alert.  When you feel hungry, start by eating small amounts of foods that are soft and easy to digest (bland), such as toast. Gradually return to your regular diet.  Drink enough fluid to keep your urine pale  yellow.  Return to your normal activities as told by your health care provider. Ask your health care provider what activities are safe for you. This information is not intended to replace advice given to you by your health care provider. Make sure you discuss any questions you have with your health care provider. Document Released: 05/08/2000 Document Revised: 02/02/2017 Document Reviewed: 09/15/2016 Elsevier Patient Education  2020 [employer].

## 2018-10-23 NOTE — Anesthesia Procedure Notes (Signed)
Anesthesia Regional Block: TAP block   Pre-Anesthetic Checklist: ,, timeout performed, Correct Patient, Correct Site, Correct Laterality, Correct Procedure, Correct Position, site marked, Risks and benefits discussed,  Surgical consent,  Pre-op evaluation,  At surgeon's request and post-op pain management  Laterality: Left  Prep: chloraprep       Needles:  Injection technique: Single-shot  Needle Type: Echogenic Stimulator Needle     Needle Length: 10cm  Needle Gauge: 21     Additional Needles:   Narrative:  Start time: 10/23/2018 7:52 AM End time: 10/23/2018 8:02 AM Injection made incrementally with aspirations every 5 mL.  Performed by: Personally

## 2018-10-23 NOTE — Anesthesia Postprocedure Evaluation (Signed)
Anesthesia Post Note  Patient: [name]  Procedure(s) Performed: OPEN LEFT INGUINAL HERNIA REPAIR WITH MESH (Left Inguinal)     Patient location during evaluation: PACU Anesthesia Type: General Level of consciousness: sedated Pain management: pain level controlled Vital Signs Assessment: post-procedure vital signs reviewed and stable Respiratory status: spontaneous breathing and respiratory function stable Cardiovascular status: stable Postop Assessment: no apparent nausea or vomiting Anesthetic complications: no    Last Vitals:  Vitals:   10/23/18 1100 10/23/18 1115  BP: 103/68 102/65  Pulse: 74 74  Resp: 16 14  Temp:  36.9 C  SpO2: 96% 95%    Last Pain:  Vitals:   10/23/18 1115  TempSrc:   PainSc: 3                  Shigeo Baugh,Verne DANIEL

## 2018-10-23 NOTE — Transfer of Care (Signed)
Immediate Anesthesia Transfer of Care Note  Patient: [name]  Procedure(s) Performed: OPEN LEFT INGUINAL HERNIA REPAIR WITH MESH (Left Inguinal)  Patient Location: PACU  Anesthesia Type:General  Level of Consciousness: awake, alert , oriented and patient cooperative  Airway & Oxygen Therapy: Patient Spontanous Breathing and Patient connected to face mask oxygen  Post-op Assessment: Report given to RN and Post -op Vital signs reviewed and stable  Post vital signs: Reviewed and stable  Last Vitals:  Vitals Value Taken Time  BP 119/66 10/23/18 1050  Temp    Pulse 77 10/23/18 1053  Resp    SpO2 100 % 10/23/18 1053  Vitals shown include unvalidated device data.  Last Pain:  Vitals:   10/23/18 0723  TempSrc: Oral  PainSc:          Complications: No apparent anesthesia complications

## 2018-10-23 NOTE — Op Note (Signed)
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  Sigmoid colon adhesions to the hernia sac.  Chronically thickened atonic cremasteric tissue. Specimens: none   EBL: [id number]   Complications: none   Description of procedure: After obtaining informed consent and placement of a TAPS block in holding by Dr. Tobias Alexander with Exparel, the patient was taken to the operating room and placed supine on operating room table where general anesthesia was initiated, preoperative antibiotics were administered, SCDs applied, and a formal timeout was performed.  Foley catheter was inserted which is brand-new to the case.  The groin was prepped and draped in the usual sterile fashion. An oblique incision was made in the just above the inguinal ligament after infiltrating the tissues with local anesthetic. Soft tissues were dissected using electrocautery until the external oblique aponeurosis was encountered. This was divided sharply to expand the external ring. A plane was bluntly developed between the spermatic cord and the external oblique.  The ilioinguinal nerve was identified, divided between hemostats and neither and ligated with 3-0 Vicryl ties. The spermatic cord was then bluntly dissected away from the pubic tubercle and encircled with a Penrose. This was very difficult given the very wide hernia neck. Inspection of the inguinal anatomy revealed a large indirect hernia which was essentially confluent with a moderate direct hernia defect. The indirect hernia sac was bluntly dissected away from the cord structures.   This was extremely difficult due to the chronicity of the  hernia with chronic scarring to the surrounding cremasteric tissue.  During this process, the testicle was delivered into the field but was easily able to be reduced.  The spermatic cord structures were identified and preserved. Once we had affirmatively identified the sac, it was carefully opened in a region that was very thin. Inspection confirmed communication with the peritoneal cavity. The sigmoid colon had been mostly reduced during manipulation of dissection, but there were thick adhesions of the colon to the inferior lateral aspect of the base of the hernia sac. These were left in situ.  The colon was inspected and confirmed to be free of injury.  The hernia sac was closed with a running pursestring 0 Vicryl, under direct visualization, just distal to the adhesions of the sigmoid to the hernia sac.  The excess sac was excised and discarded.  The floor of the inguinal canal was reapproximated with interrupted figure-of-eight sutures of 3-0 Vicryl to keep the indirect sac reduced and closed down the defect.  A 3 x 6 piece of ultra Pro mesh was broughtonto the field and trimmed to approximate the field. This was tacked to the pubic tubercle fascia using 0 ethibond. Interrupted 0 ethibonds were then used to tack the mesh to the inferior shelving edge and to the internal oblique superiorly. The tails of the mesh were wrapped around the spermatic cord, ensuring adequate room for the cord, and tacked to each other with 0 ethibond, and then directed laterally to lie flat. Hemostasis was ensured within the wound. The Penrose was removed. The external oblique aponeurosis was reapproximated with a running 3-0 Vicryl to re-create a narrowed external ring. More local was infiltrated around the pubic tubercle and in the  plane just below the external oblique. The Scarpa's was reapproximated with interrupted 3-0 Vicryls. The skin was closed with a running subcuticular Monocryl. The remainder of the local was injected in the  subcutaneous and subcuticular space. The field was then cleaned, benzoin and Steri-Strips and sterile bandage were applied. The patient was then awakened extubated and taken to PACU in stable condition.    All counts were correct at the completion of the case

## 2018-10-23 NOTE — H&P (Signed)
Surgical H&P  CC: inguinal hernia  HPI: This is a very pleasant and relatively healthy 73 year old man who is referred for evaluation of a large chronically incarcerated inguinal hernia.  He states that this is been present for many years- predates his laparoscopic total prostatectomy 11 years ago- and really has never caused him any problems.  Denies any pain, constipation, or obstructive type symptoms. When he first noticed that it was reducible but it has been chronically incarcerated for quite some time now. He underwent an attempt at screening colonoscopy and apparently this was unable to be completed due to the anatomy related to the hernia.  Subsequently he's been referred for discussion of repair.  No Known Allergies      Past Medical History:  Diagnosis Date  . Cancer [facility])    prostate  . Gout   . Hyperlipidemia   . Hypertension          Past Surgical History:  Procedure Laterality Date  . COLONOSCOPY    . PROSTATE SURGERY           Family History  Problem Relation Age of Onset  . Colon polyps Neg Hx   . Colon cancer Neg Hx   . Esophageal cancer Neg Hx   . Rectal cancer Neg Hx   . Stomach cancer Neg Hx     Social History        Socioeconomic History  . Marital status: Married    Spouse name: Not on file  . Number of children: Not on file  . Years of education: Not on file  . Highest education level: Not on file  Occupational History  . Not on file  Social Needs  . Financial resource strain: Not on file  . Food insecurity    Worry: Not on file    Inability: Not on file  . Transportation needs    Medical: Not on file    Non-medical: Not on file  Tobacco Use  . Smoking status: Former Smoker    Quit date: 02/13/1998    Years since quitting: 20.5  . Smokeless tobacco: Never Used  Substance and Sexual Activity  . Alcohol use: No  . Drug use: No  . Sexual activity: Not on file  Lifestyle  . Physical activity     Days per week: Not on file    Minutes per session: Not on file  . Stress: Not on file  Relationships  . Social Herbalist on phone: Not on file    Gets together: Not on file    Attends religious service: Not on file    Active member of club or organization: Not on file    Attends meetings of clubs or organizations: Not on file    Relationship status: Not on file  Other Topics Concern  . Not on file  Social History Narrative  . Not on file          Current Outpatient Medications on File Prior to Visit  Medication Sig Dispense Refill  . aspirin 81 MG chewable tablet Chew by mouth daily.    Marland Kitchen atorvastatin (LIPITOR) 20 MG tablet Take 20 mg by mouth daily.    Marland Kitchen HYDROCHLOROTHIAZIDE PO Take 1 tablet by mouth daily.    Marland Kitchen losartan-hydrochlorothiazide (HYZAAR) 100-25 MG tablet Take 1 tablet by mouth daily.     No current facility-administered medications on file prior to visit.     Review of Systems: a complete, 10pt review of systems was  completed with pertinent positives and negatives as documented in the HPI  Physical Exam: There were no vitals filed for this visit. Gen: alert and well appearing Eye: extraocular motion intact, no scleral icterus ENT: moist mucus membranes, dentition intact Neck: no mass or thyromegaly Chest: unlabored respirations, symmetrical air entry, clear bilaterally CV: regular rate and rhythm, no pedal edema Abdomen: soft, nontender, nondistended. No mass or organomegaly.  Multiple well-healed laparoscopic scars.  Small umbilical hernia.  Moderate chronically incarcerated right inguinal hernia.  Large chronically incarcerated left inguinal hernia. MSK: strength symmetrical throughout, no deformity Neuro: grossly intact, normal gait Psych: normal mood and affect, appropriate insight Skin: warm and dry, no rash or lesion on limited exam    CBC Latest Ref Rng & Units 06/19/2017 11/08/2006 11/02/2006  WBC 4.0 - 10.3 K/uL 6.5  10.9(H) -  Hemoglobin 13.0 - 17.1 g/dL 12.1(L) 12.3(L) 14.2  Hematocrit 38.4 - 49.9 % 36.7(L) 36.1(L) 41.3  Platelets 140 - 400 K/uL 155 182 -    CMP 11/08/2006 11/02/2006  Glucose 115(H) 108(H)  BUN 5(L) 9  Creatinine 1.00 0.89  Sodium 139 136  Potassium 3.8 3.4(L)  Chloride 105 103  CO2 29 25  Calcium 8.8 9.5    Recent Labs  No results found for: INR, PROTIME    Imaging: Imaging Results (Last 48 hours)  No results found.      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 We discussed the options of open them up or scopic repair. I advised him that typically for bilateral hernia I recommended a laparoscopic approach however he has had a total prostatectomy and so the preperitoneal planes will be scarred and this will significantly increase the complexity of left scopic hernia repair. I recommended proceeding with an open approach. We discussed repairing both hernias at same time versus repairing the left only. He would like to address the left side only. We discussed the surgery including technical details, risk of bleeding, infection, pain, scarring, injury to bowel, bladder, vessels to the testicle, vas, or nerves that can cause chronic pain. Discussed risk of hematoma, seroma, and hernia recurrence. Also discussed need for general anesthesia with risks of cardiopulmonary or vascular complications. Questions were answered. He would like to go ahead and proceed with surgery hopefully towards the end of August.   UMBILICAL HERNIA ([id number]) Story: Small, asymptomatic. No intervention required or planned at this point.  Romana Juniper, MD [facility], [place] Pager [phone]

## 2018-10-24 ENCOUNTER — Encounter (HOSPITAL_COMMUNITY): Payer: Self-pay | Admitting: Surgery

## 2018-11-14 DIAGNOSIS — K5900 Constipation, unspecified: Secondary | ICD-10-CM | POA: Diagnosis not present

## 2018-11-16 ENCOUNTER — Emergency Department (HOSPITAL_COMMUNITY): Payer: PPO | Admitting: Anesthesiology

## 2018-11-16 ENCOUNTER — Encounter (HOSPITAL_COMMUNITY): Admission: EM | Disposition: A | Discharge status: Home / Self Care | Payer: Self-pay | Source: Home / Self Care

## 2018-11-16 ENCOUNTER — Emergency Department (HOSPITAL_COMMUNITY): Payer: PPO

## 2018-11-16 ENCOUNTER — Encounter (HOSPITAL_COMMUNITY): Payer: Self-pay | Admitting: *Deleted

## 2018-11-16 ENCOUNTER — Inpatient Hospital Stay (HOSPITAL_COMMUNITY): Payer: PPO

## 2018-11-16 ENCOUNTER — Other Ambulatory Visit: Payer: Self-pay

## 2018-11-16 ENCOUNTER — Inpatient Hospital Stay (HOSPITAL_COMMUNITY)
Admission: EM | Admit: 2018-11-16 | Discharge: 2018-11-19 | DRG: 351 | Disposition: A | Discharge status: Home / Self Care | Payer: PPO | Attending: General Surgery | Admitting: General Surgery

## 2018-11-16 DIAGNOSIS — Z8546 Personal history of malignant neoplasm of prostate: Secondary | ICD-10-CM

## 2018-11-16 DIAGNOSIS — K6389 Other specified diseases of intestine: Secondary | ICD-10-CM | POA: Diagnosis not present

## 2018-11-16 DIAGNOSIS — Z4682 Encounter for fitting and adjustment of non-vascular catheter: Secondary | ICD-10-CM | POA: Diagnosis not present

## 2018-11-16 DIAGNOSIS — Z7982 Long term (current) use of aspirin: Secondary | ICD-10-CM | POA: Diagnosis not present

## 2018-11-16 DIAGNOSIS — Z978 Presence of other specified devices: Secondary | ICD-10-CM

## 2018-11-16 DIAGNOSIS — N179 Acute kidney failure, unspecified: Secondary | ICD-10-CM

## 2018-11-16 DIAGNOSIS — Z87891 Personal history of nicotine dependence: Secondary | ICD-10-CM

## 2018-11-16 DIAGNOSIS — Z20828 Contact with and (suspected) exposure to other viral communicable diseases: Secondary | ICD-10-CM | POA: Diagnosis not present

## 2018-11-16 DIAGNOSIS — E860 Dehydration: Secondary | ICD-10-CM | POA: Diagnosis not present

## 2018-11-16 DIAGNOSIS — K4030 Unilateral inguinal hernia, with obstruction, without gangrene, not specified as recurrent: Secondary | ICD-10-CM | POA: Diagnosis not present

## 2018-11-16 DIAGNOSIS — M109 Gout, unspecified: Secondary | ICD-10-CM | POA: Diagnosis present

## 2018-11-16 DIAGNOSIS — I6529 Occlusion and stenosis of unspecified carotid artery: Secondary | ICD-10-CM | POA: Diagnosis not present

## 2018-11-16 DIAGNOSIS — Z79899 Other long term (current) drug therapy: Secondary | ICD-10-CM

## 2018-11-16 DIAGNOSIS — R7989 Other specified abnormal findings of blood chemistry: Secondary | ICD-10-CM | POA: Diagnosis present

## 2018-11-16 DIAGNOSIS — I10 Essential (primary) hypertension: Secondary | ICD-10-CM | POA: Diagnosis present

## 2018-11-16 DIAGNOSIS — K460 Unspecified abdominal hernia with obstruction, without gangrene: Secondary | ICD-10-CM | POA: Diagnosis not present

## 2018-11-16 DIAGNOSIS — E785 Hyperlipidemia, unspecified: Secondary | ICD-10-CM | POA: Diagnosis not present

## 2018-11-16 DIAGNOSIS — K56609 Unspecified intestinal obstruction, unspecified as to partial versus complete obstruction: Secondary | ICD-10-CM | POA: Diagnosis not present

## 2018-11-16 DIAGNOSIS — D649 Anemia, unspecified: Secondary | ICD-10-CM | POA: Diagnosis not present

## 2018-11-16 DIAGNOSIS — D539 Nutritional anemia, unspecified: Secondary | ICD-10-CM | POA: Diagnosis present

## 2018-11-16 DIAGNOSIS — R1031 Right lower quadrant pain: Secondary | ICD-10-CM | POA: Diagnosis present

## 2018-11-16 HISTORY — PX: LAPAROSCOPY: SHX197

## 2018-11-16 HISTORY — PX: INGUINAL HERNIA REPAIR: SHX194

## 2018-11-16 LAB — CBC
HCT: 36 % — ABNORMAL LOW (ref 39.0–52.0)
Hemoglobin: 11.3 g/dL — ABNORMAL LOW (ref 13.0–17.0)
MCH: 31.1 pg (ref 26.0–34.0)
MCHC: 31.4 g/dL (ref 30.0–36.0)
MCV: 99.2 fL (ref 80.0–100.0)
Platelets: 352 10*3/uL (ref 150–400)
RBC: 3.63 MIL/uL — ABNORMAL LOW (ref 4.22–5.81)
RDW: 13.9 % (ref 11.5–15.5)
WBC: 11.1 10*3/uL — ABNORMAL HIGH (ref 4.0–10.5)
nRBC: 0.2 % (ref 0.0–0.2)

## 2018-11-16 LAB — COMPREHENSIVE METABOLIC PANEL
ALT: 18 U/L (ref 0–44)
AST: 16 U/L (ref 15–41)
Albumin: 4.4 g/dL (ref 3.5–5.0)
Alkaline Phosphatase: 97 U/L (ref 38–126)
Anion gap: 16 — ABNORMAL HIGH (ref 5–15)
BUN: 55 mg/dL — ABNORMAL HIGH (ref 8–23)
CO2: 20 mmol/L — ABNORMAL LOW (ref 22–32)
Calcium: 10.5 mg/dL — ABNORMAL HIGH (ref 8.9–10.3)
Chloride: 98 mmol/L (ref 98–111)
Creatinine, Ser: 2.74 mg/dL — ABNORMAL HIGH (ref 0.61–1.24)
GFR calc Af Amer: 25 mL/min — ABNORMAL LOW (ref >60)
GFR calc non Af Amer: 22 mL/min — ABNORMAL LOW (ref >60)
Glucose, Bld: 134 mg/dL — ABNORMAL HIGH (ref 70–99)
Potassium: 3.5 mmol/L (ref 3.5–5.1)
Sodium: 134 mmol/L — ABNORMAL LOW (ref 135–145)
Total Bilirubin: 0.8 mg/dL (ref 0.3–1.2)
Total Protein: 8.6 g/dL — ABNORMAL HIGH (ref 6.5–8.1)

## 2018-11-16 LAB — CBG MONITORING, ED: Glucose-Capillary: 118 mg/dL — ABNORMAL HIGH (ref 70–99)

## 2018-11-16 LAB — LIPASE, BLOOD: Lipase: 19 U/L (ref 11–51)

## 2018-11-16 LAB — SARS CORONAVIRUS 2 BY RT PCR (HOSPITAL ORDER, PERFORMED IN ~~LOC~~ HOSPITAL LAB): SARS Coronavirus 2: NEGATIVE

## 2018-11-16 SURGERY — REPAIR, HERNIA, INGUINAL, INCARCERATED
Anesthesia: General | Laterality: Right

## 2018-11-16 SURGERY — REPAIR, HERNIA, INGUINAL, ADULT
Anesthesia: General

## 2018-11-16 MED ORDER — FENTANYL CITRATE (PF) 100 MCG/2ML IJ SOLN
INTRAMUSCULAR | Status: AC
  Filled 2018-11-16: qty 2

## 2018-11-16 MED ORDER — FENTANYL CITRATE (PF) 100 MCG/2ML IJ SOLN
25.0000 ug | INTRAMUSCULAR | Status: DC | PRN
  Administered 2018-11-16 (×4): 25 ug via INTRAVENOUS

## 2018-11-16 MED ORDER — ONDANSETRON HCL 4 MG/2ML IJ SOLN: 4.0000 mg | Freq: Four times a day (QID) | INTRAMUSCULAR | Status: DC | PRN

## 2018-11-16 MED ORDER — BUPIVACAINE HCL (PF) 0.5 % IJ SOLN
INTRAMUSCULAR | Status: AC
  Filled 2018-11-16: qty 30

## 2018-11-16 MED ORDER — DIPHENHYDRAMINE HCL 50 MG/ML IJ SOLN: 12.5000 mg | Freq: Four times a day (QID) | INTRAMUSCULAR | Status: DC | PRN

## 2018-11-16 MED ORDER — ONDANSETRON 4 MG PO TBDP: 4.0000 mg | ORAL_TABLET | Freq: Four times a day (QID) | ORAL | Status: DC | PRN

## 2018-11-16 MED ORDER — HYDROMORPHONE HCL 1 MG/ML IJ SOLN
INTRAMUSCULAR | Status: DC | PRN
  Administered 2018-11-16: 0.5 mg via INTRAVENOUS

## 2018-11-16 MED ORDER — FENTANYL CITRATE (PF) 100 MCG/2ML IJ SOLN
INTRAMUSCULAR | Status: DC | PRN
  Administered 2018-11-16: 50 ug via INTRAVENOUS
  Administered 2018-11-16: 100 ug via INTRAVENOUS

## 2018-11-16 MED ORDER — ACETAMINOPHEN 325 MG PO TABS
650.0000 mg | ORAL_TABLET | Freq: Four times a day (QID) | ORAL | Status: DC | PRN
  Administered 2018-11-18: 20:00:00 650 mg via ORAL
  Filled 2018-11-16: qty 2

## 2018-11-16 MED ORDER — ROCURONIUM BROMIDE 10 MG/ML (PF) SYRINGE
PREFILLED_SYRINGE | INTRAVENOUS | Status: AC
  Filled 2018-11-16: qty 10

## 2018-11-16 MED ORDER — LACTATED RINGERS IV SOLN
INTRAVENOUS | Status: DC | PRN
  Administered 2018-11-16 (×2): via INTRAVENOUS

## 2018-11-16 MED ORDER — SODIUM CHLORIDE 0.9 % IR SOLN
Status: DC | PRN
  Administered 2018-11-16: 1000 mL

## 2018-11-16 MED ORDER — DEXAMETHASONE SODIUM PHOSPHATE 10 MG/ML IJ SOLN
INTRAMUSCULAR | Status: AC
  Filled 2018-11-16: qty 1

## 2018-11-16 MED ORDER — PROPOFOL 10 MG/ML IV BOLUS
INTRAVENOUS | Status: AC
  Filled 2018-11-16: qty 20

## 2018-11-16 MED ORDER — DIPHENHYDRAMINE HCL 12.5 MG/5ML PO ELIX
12.5000 mg | ORAL_SOLUTION | Freq: Four times a day (QID) | ORAL | Status: DC | PRN
  Filled 2018-11-16: qty 5

## 2018-11-16 MED ORDER — METOPROLOL TARTRATE 5 MG/5ML IV SOLN: 5.0000 mg | Freq: Four times a day (QID) | INTRAVENOUS | Status: DC | PRN

## 2018-11-16 MED ORDER — ACETAMINOPHEN 10 MG/ML IV SOLN
INTRAVENOUS | Status: DC | PRN
  Administered 2018-11-16: 1000 mg via INTRAVENOUS

## 2018-11-16 MED ORDER — PHENYLEPHRINE 40 MCG/ML (10ML) SYRINGE FOR IV PUSH (FOR BLOOD PRESSURE SUPPORT)
PREFILLED_SYRINGE | INTRAVENOUS | Status: AC
  Filled 2018-11-16: qty 10

## 2018-11-16 MED ORDER — SUGAMMADEX SODIUM 200 MG/2ML IV SOLN
INTRAVENOUS | Status: DC | PRN
  Administered 2018-11-16: 200 mg via INTRAVENOUS

## 2018-11-16 MED ORDER — ONDANSETRON HCL 4 MG/2ML IJ SOLN
INTRAMUSCULAR | Status: DC | PRN
  Administered 2018-11-16: 4 mg via INTRAVENOUS

## 2018-11-16 MED ORDER — LACTATED RINGERS IV SOLN: INTRAVENOUS | Status: DC

## 2018-11-16 MED ORDER — ACETAMINOPHEN 10 MG/ML IV SOLN
INTRAVENOUS | Status: AC
  Filled 2018-11-16: qty 100

## 2018-11-16 MED ORDER — MORPHINE SULFATE (PF) 4 MG/ML IV SOLN
2.0000 mg | INTRAVENOUS | Status: DC | PRN
  Administered 2018-11-17 – 2018-11-18 (×5): 2 mg via INTRAVENOUS
  Filled 2018-11-16 (×5): qty 1

## 2018-11-16 MED ORDER — DEXTROSE-NACL 5-0.45 % IV SOLN
INTRAVENOUS | Status: DC
  Administered 2018-11-17 – 2018-11-19 (×6): via INTRAVENOUS

## 2018-11-16 MED ORDER — ROCURONIUM BROMIDE 10 MG/ML (PF) SYRINGE
PREFILLED_SYRINGE | INTRAVENOUS | Status: DC | PRN
  Administered 2018-11-16 (×2): 10 mg via INTRAVENOUS
  Administered 2018-11-16: 40 mg via INTRAVENOUS

## 2018-11-16 MED ORDER — OXYCODONE HCL 5 MG PO TABS
5.0000 mg | ORAL_TABLET | ORAL | Status: DC | PRN
  Administered 2018-11-18 – 2018-11-19 (×3): 5 mg via ORAL
  Filled 2018-11-16 (×4): qty 1

## 2018-11-16 MED ORDER — PHENYLEPHRINE 40 MCG/ML (10ML) SYRINGE FOR IV PUSH (FOR BLOOD PRESSURE SUPPORT)
PREFILLED_SYRINGE | INTRAVENOUS | Status: DC | PRN
  Administered 2018-11-16 (×3): 100 ug via INTRAVENOUS

## 2018-11-16 MED ORDER — ENOXAPARIN SODIUM 40 MG/0.4ML ~~LOC~~ SOLN
40.0000 mg | Freq: Every day | SUBCUTANEOUS | Status: DC
  Administered 2018-11-17 – 2018-11-19 (×3): 40 mg via SUBCUTANEOUS
  Filled 2018-11-16 (×3): qty 0.4

## 2018-11-16 MED ORDER — SODIUM CHLORIDE 0.9% FLUSH: 3.0000 mL | Freq: Once | INTRAVENOUS | Status: DC

## 2018-11-16 MED ORDER — DEXAMETHASONE SODIUM PHOSPHATE 10 MG/ML IJ SOLN
INTRAMUSCULAR | Status: DC | PRN
  Administered 2018-11-16: 8 mg via INTRAVENOUS

## 2018-11-16 MED ORDER — SODIUM CHLORIDE 0.9 % IV BOLUS
1000.0000 mL | Freq: Once | INTRAVENOUS | Status: AC
  Administered 2018-11-16: 1000 mL via INTRAVENOUS

## 2018-11-16 MED ORDER — PROPOFOL 10 MG/ML IV BOLUS
INTRAVENOUS | Status: DC | PRN
  Administered 2018-11-16: 170 mg via INTRAVENOUS

## 2018-11-16 MED ORDER — LIDOCAINE 2% (20 MG/ML) 5 ML SYRINGE
INTRAMUSCULAR | Status: AC
  Filled 2018-11-16: qty 5

## 2018-11-16 MED ORDER — SUCCINYLCHOLINE CHLORIDE 200 MG/10ML IV SOSY
PREFILLED_SYRINGE | INTRAVENOUS | Status: DC | PRN
  Administered 2018-11-16: 100 mg via INTRAVENOUS

## 2018-11-16 MED ORDER — SUCCINYLCHOLINE CHLORIDE 200 MG/10ML IV SOSY
PREFILLED_SYRINGE | INTRAVENOUS | Status: AC
  Filled 2018-11-16: qty 10

## 2018-11-16 MED ORDER — ONDANSETRON HCL 4 MG/2ML IJ SOLN: 4.0000 mg | Freq: Once | INTRAMUSCULAR | Status: DC | PRN

## 2018-11-16 MED ORDER — HYDROMORPHONE HCL 2 MG/ML IJ SOLN
INTRAMUSCULAR | Status: AC
  Filled 2018-11-16: qty 1

## 2018-11-16 MED ORDER — SODIUM CHLORIDE 0.9 % IV SOLN
2.0000 g | Freq: Once | INTRAVENOUS | Status: AC
  Administered 2018-11-16: 2 g via INTRAVENOUS
  Filled 2018-11-16: qty 2

## 2018-11-16 MED ORDER — LIDOCAINE 2% (20 MG/ML) 5 ML SYRINGE
INTRAMUSCULAR | Status: DC | PRN
  Administered 2018-11-16: 100 mg via INTRAVENOUS

## 2018-11-16 MED ORDER — ACETAMINOPHEN 650 MG RE SUPP
650.0000 mg | Freq: Four times a day (QID) | RECTAL | Status: DC | PRN
  Filled 2018-11-16: qty 1

## 2018-11-16 MED ORDER — ONDANSETRON HCL 4 MG/2ML IJ SOLN
INTRAMUSCULAR | Status: AC
  Filled 2018-11-16: qty 2

## 2018-11-16 MED ORDER — BUPIVACAINE HCL (PF) 0.5 % IJ SOLN
INTRAMUSCULAR | Status: DC | PRN
  Administered 2018-11-16: 30 mL

## 2018-11-16 SURGICAL SUPPLY — 54 items
BLADE SURG SZ11 CARB STEEL (BLADE) ×5 IMPLANT
CHLORAPREP W/TINT 26 (MISCELLANEOUS) ×5 IMPLANT
CLOSURE STERI-STRIP 1/2X4 (GAUZE/BANDAGES/DRESSINGS) ×1
CLSR STERI-STRIP ANTIMIC 1/2X4 (GAUZE/BANDAGES/DRESSINGS) ×4 IMPLANT
COVER MAYO STAND STRL (DRAPES) ×5 IMPLANT
COVER SURGICAL LIGHT HANDLE (MISCELLANEOUS) ×5 IMPLANT
COVER WAND RF STERILE (DRAPES) IMPLANT
DRAIN PENROSE 0.5X18 (DRAIN) ×5 IMPLANT
DRAPE LAPAROSCOPIC ABDOMINAL (DRAPES) ×5 IMPLANT
DRAPE UTILITY XL STRL (DRAPES) ×5 IMPLANT
DRAPE WARM FLUID 44X44 (DRAPES) ×5 IMPLANT
DRSG OPSITE POSTOP 4X10 (GAUZE/BANDAGES/DRESSINGS) IMPLANT
DRSG OPSITE POSTOP 4X6 (GAUZE/BANDAGES/DRESSINGS) IMPLANT
DRSG OPSITE POSTOP 4X8 (GAUZE/BANDAGES/DRESSINGS) IMPLANT
DRSG TELFA PLUS 4X6 ADH ISLAND (GAUZE/BANDAGES/DRESSINGS) ×5 IMPLANT
ELECT REM PT RETURN 15FT ADLT (MISCELLANEOUS) ×5 IMPLANT
GLOVE BIOGEL PI IND STRL 7.0 (GLOVE) ×3 IMPLANT
GLOVE BIOGEL PI INDICATOR 7.0 (GLOVE) ×2
GLOVE SURG SS PI 7.0 STRL IVOR (GLOVE) ×5 IMPLANT
GOWN STRL REUS W/TWL LRG LVL3 (GOWN DISPOSABLE) ×10 IMPLANT
GOWN STRL REUS W/TWL XL LVL3 (GOWN DISPOSABLE) ×10 IMPLANT
HANDLE SUCTION POOLE (INSTRUMENTS) ×3 IMPLANT
KIT BASIN OR (CUSTOM PROCEDURE TRAY) ×5 IMPLANT
KIT TURNOVER KIT A (KITS) IMPLANT
MESH PHASIX RESORB RECT 10X15 (Mesh General) ×5 IMPLANT
PACK GENERAL/GYN (CUSTOM PROCEDURE TRAY) ×5 IMPLANT
RELOAD PROXIMATE 100 BLUE (MISCELLANEOUS) IMPLANT
RELOAD PROXIMATE 100MM BLUE (MISCELLANEOUS)
SET TUBE SMOKE EVAC HIGH FLOW (TUBING) ×5 IMPLANT
SLEEVE ENDOPATH XCEL 5M (ENDOMECHANICALS) ×5 IMPLANT
SPONGE LAP 18X18 RF (DISPOSABLE) IMPLANT
STAPLER PROXIMATE 100MM BLUE (MISCELLANEOUS) IMPLANT
STAPLER VISISTAT 35W (STAPLE) IMPLANT
SUCTION POOLE HANDLE (INSTRUMENTS) ×5
SUT MNCRL AB 4-0 PS2 18 (SUTURE) ×10 IMPLANT
SUT NOVA 0 T19/GS 22DT (SUTURE) ×15 IMPLANT
SUT PDS AB 0 CT1 36 (SUTURE) IMPLANT
SUT PROLENE 2 0 CT2 30 (SUTURE) ×5 IMPLANT
SUT SILK 2 0 (SUTURE)
SUT SILK 2 0 SH CR/8 (SUTURE) IMPLANT
SUT SILK 2-0 18XBRD TIE 12 (SUTURE) IMPLANT
SUT SILK 3 0 (SUTURE) ×4
SUT SILK 3 0 SH CR/8 (SUTURE) ×5 IMPLANT
SUT SILK 3-0 18XBRD TIE 12 (SUTURE) ×6 IMPLANT
SUT VIC AB 2-0 CT1 27 (SUTURE) ×2
SUT VIC AB 2-0 CT1 TAPERPNT 27 (SUTURE) ×3 IMPLANT
SUT VIC AB 3-0 SH 27 (SUTURE) ×4
SUT VIC AB 3-0 SH 27XBRD (SUTURE) ×6 IMPLANT
TOWEL OR 17X26 10 PK STRL BLUE (TOWEL DISPOSABLE) ×5 IMPLANT
TOWEL OR NON WOVEN STRL DISP B (DISPOSABLE) ×5 IMPLANT
TRAY FOLEY MTR SLVR 14FR STAT (SET/KITS/TRAYS/PACK) ×5 IMPLANT
TRAY FOLEY MTR SLVR 16FR STAT (SET/KITS/TRAYS/PACK) ×5 IMPLANT
TROCAR BLADELESS OPT 5 100 (ENDOMECHANICALS) ×5 IMPLANT
YANKAUER SUCT BULB TIP NO VENT (SUCTIONS) ×5 IMPLANT

## 2018-11-16 NOTE — Anesthesia Preprocedure Evaluation (Addendum)
Anesthesia Evaluation  Patient identified by MRN, date of birth, ID band Patient awake    Reviewed: Allergy & Precautions, NPO status , Patient's Chart, lab work & pertinent test results  History of Anesthesia Complications Negative for: history of anesthetic complications  Airway Mallampati: II  TM Distance: >3 FB Neck ROM: Full    Dental  (+) Edentulous Upper, Partial Lower, Dental Advisory Given   Pulmonary former smoker,    Pulmonary exam normal        Cardiovascular hypertension, Pt. on medications Normal cardiovascular exam Rhythm:Regular Rate:Normal     Neuro/Psych negative neurological ROS  negative psych ROS   GI/Hepatic Neg liver ROS, incarcerated bowel right inguinal hernia   Endo/Other  Obesity   Renal/GU Renal disease   Prostate cancer     Musculoskeletal negative musculoskeletal ROS (+)   Abdominal   Peds  Hematology negative hematology ROS (+)   Anesthesia Other Findings Day of surgery medications reviewed with the patient.  Reproductive/Obstetrics                            Anesthesia Physical  Anesthesia Plan  ASA: II and emergent  Anesthesia Plan: General   Post-op Pain Management:    Induction: Intravenous  PONV Risk Score and Plan: 2 and Ondansetron and Dexamethasone  Airway Management Planned: Oral ETT  Additional Equipment:   Intra-op Plan:   Post-operative Plan: Extubation in OR  Informed Consent: I have reviewed the patients History and Physical, chart, labs and discussed the procedure including the risks, benefits and alternatives for the proposed anesthesia with the patient or authorized representative who has indicated his/her understanding and acceptance.     Dental advisory given  Plan Discussed with: CRNA and Anesthesiologist  Anesthesia Plan Comments:        Anesthesia Quick Evaluation

## 2018-11-16 NOTE — ED Notes (Signed)
ED Provider at bedside. 

## 2018-11-16 NOTE — ED Triage Notes (Signed)
Pt had surgery on the 9th, every thing was fine till Tuesday when "everything shut down" now has constipation, nausea, weakness.

## 2018-11-16 NOTE — ED Notes (Signed)
Patient transported to CT 

## 2018-11-16 NOTE — ED Notes (Signed)
Surgery at bedside.

## 2018-11-16 NOTE — Anesthesia Procedure Notes (Signed)
Procedure Name: Intubation Date/Time: 11/16/2018 9:03 PM Performed by: Anne Fu, CRNA Pre-anesthesia Checklist: Patient identified, Emergency Drugs available, Suction available, Patient being monitored and Timeout performed Patient Re-evaluated:Patient Re-evaluated prior to induction Oxygen Delivery Method: Circle system utilized Preoxygenation: Pre-oxygenation with 100% oxygen Induction Type: IV induction and Rapid sequence Laryngoscope Size: Mac and 4 Grade View: Grade I Tube type: Oral Tube size: 7.5 mm Number of attempts: 1 Airway Equipment and Method: Stylet Placement Confirmation: ETT inserted through vocal cords under direct vision,  positive ETCO2 and breath sounds checked- equal and bilateral Secured at: 24 cm Tube secured with: Tape Dental Injury: Teeth and Oropharynx as per pre-operative assessment

## 2018-11-16 NOTE — ED Provider Notes (Signed)
[facility] DEPT Provider Note   CSN: [id number] Arrival date & time: 11/16/18  1146     History   Chief Complaint Chief Complaint  Patient presents with   Constipation   Weakness   Nausea    HPI [name] is a 73 y.o. male.     HPI Patient had inguinal hernia repair performed early September.  States he is been doing well since.  Patient states for the past 4 days he has not had a bowel movement and passed limited flatus.  Also states he has had nausea and developed vomiting yesterday.  States his abdomen has become distended.  Denies any fever or chills.  Denies decreased urination or any other urinary symptoms.  Complains of generalized fatigue. Past Medical History:  Diagnosis Date   Cancer [facility])    prostate   Gout    Hyperlipidemia    Hypertension     Patient Active Problem List   Diagnosis Date Noted   Normocytic anemia 06/19/2017   Thrombocytopenia ([place]) 06/19/2017   Occlusion and stenosis of carotid artery without mention of cerebral infarction 06/09/2013    Past Surgical History:  Procedure Laterality Date   COLONOSCOPY     INGUINAL HERNIA REPAIR Left 10/23/2018   Procedure: OPEN LEFT INGUINAL HERNIA REPAIR WITH MESH;  Surgeon: Clovis Riley, MD;  Location: [facility] ORS;  Service: General;  Laterality: Left;   PROSTATE SURGERY          Home Medications    Prior to Admission medications   Medication Sig Start Date End Date Taking? Authorizing Provider  aspirin 81 MG chewable tablet Chew 81 mg by mouth daily.    Yes [provider]  atorvastatin (LIPITOR) 10 MG tablet Take 10 mg by mouth daily.   Yes [provider]  docusate sodium (COLACE) 100 MG capsule Take 1 capsule (100 mg total) by mouth 2 (two) times daily. 10/23/18 11/22/18 Yes Clovis Riley, MD  ibuprofen (ADVIL) 200 MG tablet Take 200-400 mg by mouth every 8 (eight) hours as needed (pain.).   Yes [provider]    losartan-hydrochlorothiazide (HYZAAR) 100-25 MG tablet Take 0.5 tablets by mouth daily.  05/16/18  Yes [provider]  oxyCODONE (ROXICODONE) 5 MG immediate release tablet Take 1 tablet (5 mg total) by mouth every 8 (eight) hours as needed for moderate pain or severe pain. Alternate tylenol and ibuprofen for the first few days. Take narcotic pain medication only if needed for severe/ breakthrough pain. 10/23/18 10/23/19 Yes Clovis Riley, MD    Family History Family History  Problem Relation Age of Onset   Colon polyps Neg Hx    Colon cancer Neg Hx    Esophageal cancer Neg Hx    Rectal cancer Neg Hx    Stomach cancer Neg Hx     Social History Social History   Tobacco Use   Smoking status: Former Smoker    Quit date: 02/13/1998    Years since quitting: 20.7   Smokeless tobacco: Never Used  Substance Use Topics   Alcohol use: No   Drug use: No     Allergies   Patient has no known allergies.   Review of Systems Review of Systems  Constitutional: Positive for appetite change and fatigue. Negative for chills and fever.  HENT: Negative for sore throat and trouble swallowing.   Respiratory: Negative for cough and shortness of breath.   Cardiovascular: Negative for chest pain.  Gastrointestinal: Positive for abdominal  distention, abdominal pain, constipation, nausea and vomiting. Negative for diarrhea.  Genitourinary: Negative for dysuria, flank pain, frequency and hematuria.  Musculoskeletal: Negative for back pain, myalgias and neck pain.  Skin: Negative for rash and wound.  Neurological: Negative for dizziness, light-headedness, numbness and headaches.  All other systems reviewed and are negative.    Physical Exam Updated Vital Signs BP (!) 169/87    Pulse 90    Temp 98 F (36.7 C) (Oral)    Resp 20    SpO2 98%   Physical Exam Vitals signs and nursing note reviewed.  Constitutional:      Appearance: Normal appearance. He is well-developed.  HENT:      Head: Normocephalic and atraumatic.     Mouth/Throat:     Mouth: Mucous membranes are moist.  Eyes:     Pupils: Pupils are equal, round, and reactive to light.  Neck:     Musculoskeletal: Normal range of motion and neck supple.  Cardiovascular:     Rate and Rhythm: Regular rhythm. Tachycardia present.     Heart sounds: No murmur. No friction rub. No gallop.   Pulmonary:     Effort: Pulmonary effort is normal. No respiratory distress.     Breath sounds: Normal breath sounds. No stridor. No wheezing, rhonchi or rales.  Chest:     Chest wall: No tenderness.  Abdominal:     General: Bowel sounds are normal. There is distension.     Palpations: Abdomen is soft.     Tenderness: There is no abdominal tenderness. There is no guarding or rebound.     Hernia: A hernia is present.     Comments: Abdomen is distended and taut.  No tenderness to palpation.  High-pitched bowel sounds are heard especially in the right side of the abdomen.  Right inguinal hernia.  Unable to reduce.  No erythema warmth or tenderness.  Musculoskeletal: Normal range of motion.        General: No swelling, tenderness, deformity or signs of injury.     Right lower leg: No edema.     Left lower leg: No edema.  Skin:    General: Skin is warm and dry.     Findings: No erythema or rash.  Neurological:     General: No focal deficit present.     Mental Status: He is alert and oriented to person, place, and time.  Psychiatric:        Behavior: Behavior normal.      ED Treatments / Results  Labs (all labs ordered are listed, but only abnormal results are displayed) Labs Reviewed  COMPREHENSIVE METABOLIC PANEL - Abnormal; Notable for the following components:      Result Value   Sodium 134 (*)    CO2 20 (*)    Glucose, Bld 134 (*)    BUN 55 (*)    Creatinine, Ser 2.74 (*)    Calcium 10.5 (*)    Total Protein 8.6 (*)    GFR calc non Af Amer 22 (*)    GFR calc Af Amer 25 (*)    Anion gap 16 (*)    All other  components within normal limits  CBC - Abnormal; Notable for the following components:   WBC 11.1 (*)    RBC 3.63 (*)    Hemoglobin 11.3 (*)    HCT 36.0 (*)    All other components within normal limits  CBG MONITORING, ED - Abnormal; Notable for the following components:   Glucose-Capillary 118 (*)  All other components within normal limits  SARS CORONAVIRUS 2 (HOSPITAL ORDER, [place] [facility])  LIPASE, BLOOD  URINALYSIS, ROUTINE W REFLEX MICROSCOPIC    EKG None  Radiology Ct Abdomen Pelvis Wo Contrast  Result Date: 11/16/2018 CLINICAL DATA:  Abdominal distension and pain. Hernia surgery 10/23/2018. EXAM: CT ABDOMEN AND PELVIS WITHOUT CONTRAST TECHNIQUE: Multidetector CT imaging of the abdomen and pelvis was performed following the standard protocol without IV contrast. COMPARISON:  05/30/2017 CT abdomen/pelvis. FINDINGS: Lower chest: Mild platelike atelectasis at both lung bases. Few scattered small solid pulmonary nodules at both lung bases, largest 4 mm in the right lower lobe (series 4/image 26), not seen on prior CT. Coronary atherosclerosis Hepatobiliary: Normal liver size. No liver mass. Normal gallbladder with no radiopaque cholelithiasis. No biliary ductal dilatation. Pancreas: Normal, with no mass or duct dilation. Spleen: Normal size. No mass. Adrenals/Urinary Tract: No discrete adrenal nodules. No renal stones. No hydronephrosis. Minimally complex 2.5 cm renal cyst in the posterior upper left kidney with thin mural calcification (series 2/image 40), considered Bosniak 2. no additional contour deforming renal lesions. Normal bladder. Stomach/Bowel: Normal non-distended stomach. There is high-grade sigmoid colon obstruction due to moderate right inguinal hernia containing portion of the sigmoid colon, proximal to which the large bowel is markedly distended with air-fluid levels. Mid to distal small bowel is diffusely dilated up to 6.2 cm diameter with air-fluid  levels. No small or large bowel wall thickening or pneumatosis. Normal appendix. Vascular/Lymphatic: Atherosclerotic nonaneurysmal abdominal aorta. No pathologically enlarged lymph nodes in the abdomen or pelvis. Reproductive: Apparent prostatectomy. Other: No pneumoperitoneum, ascites or focal fluid collection. Musculoskeletal: No aggressive appearing focal osseous lesions. Mild-to-moderate thoracolumbar spondylosis. IMPRESSION: 1. High-grade distal colonic obstruction due to incarcerated right inguinal hernia containing a portion of the sigmoid colon. No pneumatosis or pneumoperitoneum. 2. Mild platelike atelectasis at both lung bases. 3. Scattered small solid pulmonary nodules at the lung bases, largest 4 mm, new from prior CT. Recommend follow-up chest CT in 3 months given history of prostate cancer. 4.  Aortic Atherosclerosis (ICD10-I70.0). Electronically Signed   By: Ilona Sorrel M.D.   On: 11/16/2018 17:52    Procedures Procedures (including critical care time)  Medications Ordered in ED Medications  sodium chloride flush (NS) 0.9 % injection 3 mL (has no administration in time range)  sodium chloride flush (NS) 0.9 % injection 3 mL (has no administration in time range)  cefoTEtan (CEFOTAN) 2 g in sodium chloride 0.9 % 100 mL IVPB (has no administration in time range)  sodium chloride 0.9 % bolus 1,000 mL (0 mLs Intravenous Stopped 11/16/18 1859)  fentaNYL (SUBLIMAZE) 100 MCG/2ML injection (has no administration in time range)  propofol (DIPRIVAN) 10 mg/mL bolus/IV push (has no administration in time range)   CRITICAL CARE Performed by: Julianne Rice Total critical care time: 25 minutes Critical care time was exclusive of separately billable procedures and treating other patients. Critical care was necessary to treat or prevent imminent or life-threatening deterioration. Critical care was time spent personally by me on the following activities: development of treatment plan with patient  and/or surrogate as well as nursing, discussions with consultants, evaluation of patient's response to treatment, examination of patient, obtaining history from patient or surrogate, ordering and performing treatments and interventions, ordering and review of laboratory studies, ordering and review of radiographic studies, pulse oximetry and re-evaluation of patient's condition.  Initial Impression / Assessment and Plan / ED Course  I have reviewed the triage vital signs and  the nursing notes.  Pertinent labs & imaging results that were available during my care of the patient were reviewed by me and considered in my medical decision making (see chart for details).        Patient with new AKI.  Question whether this may be due to dehydration along with his mild hypotension and tachycardia.  Will initiate IV fluids.  Also concern for possible small bowel obstruction.  Will get CT abdomen/pelvis.   Colonic obstruction due to incarcerated hernia on the right.  Patient states he is feeling much better after IV fluids.  Blood pressure is significantly improved.  Discussed with Dr. Kieth Brightly who will see patient emergency department.  Dr. Kieth Brightly will take patient to the operating room. Final Clinical Impressions(s) / ED Diagnoses   Final diagnoses:  Colonic obstruction ([place])  Incarcerated right inguinal hernia  AKI (acute kidney injury) [facility])  Dehydration    ED Discharge Orders    None       Julianne Rice, MD 11/16/18 1939

## 2018-11-16 NOTE — Consult Note (Signed)
Reason for Consult: Incarcerated Inguinal Hernia Referring Physician: Dr. Julianne Rice  BADR CRISANTOS is an 73 y.o. male.  HPI:   Mr. Trottier is a 73 yo male with a history of hypertension, hyperlipidemia and left sided inguinal hernia repair on 10/23/2018 who presented to the ED with a 4 day history of right sided abdominal pain.  He notes that the pain began Tuesday night, and has become progressively worse over the past 4 days.  He is experiencing a loss of appetite due to the pain, stating that he has not eaten a full meal since Tuesday.  He also reports nausea with 3-4 episodes of vomiting yesterday.  He reports that his last bowel movement was on Tuesday.  He denies fevers, chills, warmth or redness of skin at right abdomen.  He reports that he has had a small right sided inguinal hernia for years.  Prior to the surgery on 10/23/2018, he and Dr. Kae Heller discussed a bilateral repair, but patient decided to focus on the left inguinal hernia at that time.  He was a former smoker, but notes that he discontinued smoking 20 years ago.  He also has a history of abdominal surgery for prostate cancer in 2012.    ED workup included a CT abdomen demonstrating high grade distal colon obstruction due to incarcerated right inguinal hernia containing sigmoid colon without pneumatosis or pneumoperitoneum.    Past Medical History:  Diagnosis Date  . Cancer [facility])    prostate  . Gout   . Hyperlipidemia   . Hypertension     Past Surgical History:  Procedure Laterality Date  . COLONOSCOPY    . INGUINAL HERNIA REPAIR Left 10/23/2018   Procedure: OPEN LEFT INGUINAL HERNIA REPAIR WITH MESH;  Surgeon: Clovis Riley, MD;  Location: [facility]S;  Service: General;  Laterality: Left;  . PROSTATE SURGERY      Family History  Problem Relation Age of Onset  . Colon polyps Neg Hx   . Colon cancer Neg Hx   . Esophageal cancer Neg Hx   . Rectal cancer Neg Hx   . Stomach cancer Neg Hx     Social History:   reports that he quit smoking about 20 years ago. He has never used smokeless tobacco. He reports that he does not drink alcohol or use drugs.  Allergies: No Known Allergies  Medications: I have reviewed the patient's current medications.  Results for orders placed or performed during the hospital encounter of 11/16/18 (from the past 48 hour(s))  Lipase, blood     Status: None   Collection Time: 11/16/18 12:53 PM  Result Value Ref Range   Lipase 19 11 - 51 U/L    Comment: Performed at [facility], [place] [address] [place], [place] [address]  Comprehensive metabolic panel     Status: Abnormal   Collection Time: 11/16/18 12:53 PM  Result Value Ref Range   Sodium 134 (L) 135 - 145 mmol/L   Potassium 3.5 3.5 - 5.1 mmol/L   Chloride 98 98 - 111 mmol/L   CO2 20 (L) 22 - 32 mmol/L   Glucose, Bld 134 (H) 70 - 99 mg/dL   BUN 55 (H) 8 - 23 mg/dL   Creatinine, Ser 2.74 (H) 0.61 - 1.24 mg/dL   Calcium 10.5 (H) 8.9 - 10.3 mg/dL   Total Protein 8.6 (H) 6.5 - 8.1 g/dL   Albumin 4.4 3.5 - 5.0 g/dL   AST 16 15 - 41 U/L  ALT 18 0 - 44 U/L   Alkaline Phosphatase 97 38 - 126 U/L   Total Bilirubin 0.8 0.3 - 1.2 mg/dL   GFR calc non Af Amer 22 (L) >60 mL/min   GFR calc Af Amer 25 (L) >60 mL/min   Anion gap 16 (H) 5 - 15    Comment: Performed at [facility], [place] [address] [place], [place] [address]  CBC     Status: Abnormal   Collection Time: 11/16/18 12:53 PM  Result Value Ref Range   WBC 11.1 (H) 4.0 - 10.5 K/uL   RBC 3.63 (L) 4.22 - 5.81 MIL/uL   Hemoglobin 11.3 (L) 13.0 - 17.0 g/dL   HCT 36.0 (L) 39.0 - 52.0 %   MCV 99.2 80.0 - 100.0 fL   MCH 31.1 26.0 - 34.0 pg   MCHC 31.4 30.0 - 36.0 g/dL   RDW 13.9 11.5 - 15.5 %   Platelets 352 150 - 400 K/uL   nRBC 0.2 0.0 - 0.2 %    Comment: Performed at [facility], [place] [address], [place],  [address]  SARS Coronavirus 2 [facility] order, Performed in [facility] hospital lab)  Nasopharyngeal Nasopharyngeal Swab     Status: None   Collection Time: 11/16/18  4:24 PM   Specimen: Nasopharyngeal Swab  Result Value Ref Range   SARS Coronavirus 2 NEGATIVE NEGATIVE    Comment: (NOTE) If result is NEGATIVE SARS-CoV-2 target nucleic acids are NOT DETECTED. The SARS-CoV-2 RNA is generally detectable in upper and lower  respiratory specimens during the acute phase of infection. The lowest  concentration of SARS-CoV-2 viral copies this assay can detect is 250  copies / mL. A negative result does not preclude SARS-CoV-2 infection  and should not be used as the sole basis for treatment or other  patient management decisions.  A negative result may occur with  improper specimen collection / handling, submission of specimen other  than nasopharyngeal swab, presence of viral mutation(s) within the  areas targeted by this assay, and inadequate number of viral copies  (<250 copies / mL). A negative result must be combined with clinical  observations, patient history, and epidemiological information. If result is POSITIVE SARS-CoV-2 target nucleic acids are DETECTED. The SARS-CoV-2 RNA is generally detectable in upper and lower  respiratory specimens dur ing the acute phase of infection.  Positive  results are indicative of active infection with SARS-CoV-2.  Clinical  correlation with patient history and other diagnostic information is  necessary to determine patient infection status.  Positive results do  not rule out bacterial infection or co-infection with other viruses. If result is PRESUMPTIVE POSTIVE SARS-CoV-2 nucleic acids MAY BE PRESENT.   A presumptive positive result was obtained on the submitted specimen  and confirmed on repeat testing.  While 2019 novel coronavirus  (SARS-CoV-2) nucleic acids may be present in the submitted sample  additional confirmatory testing may be necessary for epidemiological  and / or clinical management purposes  to differentiate  between  SARS-CoV-2 and other Sarbecovirus currently known to infect humans.  If clinically indicated additional testing with an alternate test  methodology [phone]) is advised. The SARS-CoV-2 RNA is generally  detectable in upper and lower respiratory sp ecimens during the acute  phase of infection. The expected result is Negative. Fact Sheet for Patients:  StrictlyIdeas.no Fact Sheet for Healthcare Providers: BankingDealers.co.za This test is not yet approved or cleared by the [place] FDA and has been authorized for detection  and/or diagnosis of SARS-CoV-2 by FDA under an Emergency Use Authorization (EUA).  This EUA will remain in effect (meaning this test can be used) for the duration of the COVID-19 declaration under Section 564(b)(1) of the Act, 21 U.S.C. section 360bbb-3(b)(1), unless the authorization is terminated or revoked sooner. Performed at [facility], [place] [address] [place], [place] [address]   CBG monitoring, ED     Status: Abnormal   Collection Time: 11/16/18  4:39 PM  Result Value Ref Range   Glucose-Capillary 118 (H) 70 - 99 mg/dL    Ct Abdomen Pelvis Wo Contrast  Result Date: 11/16/2018 CLINICAL DATA:  Abdominal distension and pain. Hernia surgery 10/23/2018. EXAM: CT ABDOMEN AND PELVIS WITHOUT CONTRAST TECHNIQUE: Multidetector CT imaging of the abdomen and pelvis was performed following the standard protocol without IV contrast. COMPARISON:  05/30/2017 CT abdomen/pelvis. FINDINGS: Lower chest: Mild platelike atelectasis at both lung bases. Few scattered small solid pulmonary nodules at both lung bases, largest 4 mm in the right lower lobe (series 4/image 26), not seen on prior CT. Coronary atherosclerosis Hepatobiliary: Normal liver size. No liver mass. Normal gallbladder with no radiopaque cholelithiasis. No biliary ductal dilatation. Pancreas: Normal, with no mass or duct dilation. Spleen:  Normal size. No mass. Adrenals/Urinary Tract: No discrete adrenal nodules. No renal stones. No hydronephrosis. Minimally complex 2.5 cm renal cyst in the posterior upper left kidney with thin mural calcification (series 2/image 40), considered Bosniak 2. no additional contour deforming renal lesions. Normal bladder. Stomach/Bowel: Normal non-distended stomach. There is high-grade sigmoid colon obstruction due to moderate right inguinal hernia containing portion of the sigmoid colon, proximal to which the large bowel is markedly distended with air-fluid levels. Mid to distal small bowel is diffusely dilated up to 6.2 cm diameter with air-fluid levels. No small or large bowel wall thickening or pneumatosis. Normal appendix. Vascular/Lymphatic: Atherosclerotic nonaneurysmal abdominal aorta. No pathologically enlarged lymph nodes in the abdomen or pelvis. Reproductive: Apparent prostatectomy. Other: No pneumoperitoneum, ascites or focal fluid collection. Musculoskeletal: No aggressive appearing focal osseous lesions. Mild-to-moderate thoracolumbar spondylosis. IMPRESSION: 1. High-grade distal colonic obstruction due to incarcerated right inguinal hernia containing a portion of the sigmoid colon. No pneumatosis or pneumoperitoneum. 2. Mild platelike atelectasis at both lung bases. 3. Scattered small solid pulmonary nodules at the lung bases, largest 4 mm, new from prior CT. Recommend follow-up chest CT in 3 months given history of prostate cancer. 4.  Aortic Atherosclerosis (ICD10-I70.0). Electronically Signed   By: Ilona Sorrel M.D.   On: 11/16/2018 17:52    Review of Systems  Constitutional: Negative for chills and fever.  Gastrointestinal: Positive for abdominal pain, constipation, nausea and vomiting.  Skin:       Denies localized redness of skin or heat of skin at hernia site  All other systems reviewed and are negative.  Blood pressure 128/78, pulse 86, temperature 98 F (36.7 C), temperature source  Oral, resp. rate 19, SpO2 100 %. Physical Exam  Constitutional: He is oriented to person, place, and time. He appears well-developed and well-nourished. No distress.  Neck: Normal range of motion.  Cardiovascular: Normal rate, regular rhythm and normal heart sounds.  Respiratory: Effort normal.  GI: He exhibits distension and mass (nonreducible mass in right pelvis). There is abdominal tenderness. There is guarding.  Neurological: He is alert and oriented to person, place, and time.  Skin: Skin is warm and dry. He is not diaphoretic.  Psychiatric: He has a normal mood and affect. His behavior is normal.  Judgment and thought content normal.      Assessment/Plan: Mr. Sidor is a 73 yo male who presented to the ED with a 4 day history of right abdominal pain, constipation, nausea, loss of appetite.  CT abdomen consistent with right sided incarcerated inguinal hernia containing sigmoid colon without pneumoperitoneum.    - Urgent right sided hernia repair.  Discussed procedure with patient.  Suzanna Obey 11/16/2018, 6:51 PM

## 2018-11-16 NOTE — Op Note (Signed)
Preop diagnosis: right obstructing inguinal hernia  Postop diagnosis: right obstructing inguinal hernia  Procedure: open Right inguinal hernia repair with mesh for obstructing hernia, diagnostic laparoscopy  Surgeon: Gurney Maxin, M.D.  Asst: Jeralyn Bennett  Anesthesia: Gen.   Indications for procedure: [name] is a 73 y.o. male with symptoms of pain and enlarging Right inguinal hernia(s). Over the last 4 days he has had worsening abdominal pain and 2 days of nausea and vomiting. He came to the ER and was found to have a large bowel obstruction and taken to the OR for repair and possible intestinal resection.  Description of procedure: The patient was brought into the operative suite, placed supine. Anesthesia was administered with endotracheal tube. Patient was strapped in place. The patient was prepped and draped in the usual sterile fashion.  The anterior superior iliac spine and pubic tubercle were identified on the Right side. An incision was made 1cm above the connecting line, representative of the location of the inguinal ligament. The subcutaneous tissue was bluntly dissected, scarpa's fascia was dissected away. The external abdominal oblique fascia was identified and sharply opened down to the external inguinal ring. The conjoint tendon and inguinal ligament were identified. The hernia sac was large and colon was visible within the sac. The sac was incised, the colon appeared inflamed but without ischemia or perforation. The conjoint tendon was partially divided to allow reduction. The sac was dissected free of the cord structures and divided distally.  A penrose drain was used to encircle the cord structures. The hernia sac was dissected down to the internal inguinal ring. Preperitoneal fat was identified showing appropriate dissection. The sac was closed with a running 3-0 vicryl. The sac was then reduced into the preperitoneal space. The inguinal ligament was sutured to the  conjoint tendon with interrupted 0 novafil. A 3x6 Phasix mesh was then used to close the defect and reinforce the floor. The mesh was sutured to the lacunar ligament and inguinal ligament using a 0 novafil in interrupted fashion. Next the superior edge of the mesh was sutured to the conjoined tendon using a 0 novafil in interrupted fashion. An additional novafil. was used to suture the tail ends of the mesh together re-creating the deep ring. Cord structures are running in a neutral position through the mesh. Next the external abdominal oblique fascia was closed with a 2-0 Vicryl in running fashion to re-create the external inguinal ring. Scarpa's fascia was closed with 3-0 Vicryl in running fashion. Skin was closed with a 4-0 Monocryl subcuticular stitch in running fashion.   Next, A left subcostal incision was made. A 79mm trocar was used to gain access to the peritoneal cavity by optical entry technique. Pneumoperitoneum was applied with a high flow and medium pressure. The laparoscope was reinserted to confirm position. Due to bowel distension there was minimal working room. The laparoscope was able to visualize the left lower quadrant. A second trocar was placed in the left mid abdomen. A grasper was used to help identify the colon near the hernia repair. No ischemia or discoloration was seen. Pneumoperitoneum was evacuated. The trocars were removed. The skin was closed with interrupted 4-0 monocryl. Steristrips and dressings were put in place. Patient woke from anesthesia and brought to PACU in stable condition. All counts are correct.  Findings: large right obstructing indirect inguinal hernia  Specimen: none  Blood loss: 30 ml  Local anesthesia: 10 ml Marcaine   Complications: none  Implant: Phasix mesh  Gurney Maxin, M.D.  General, Bariatric, & Minimally Invasive Surgery [facility], [place] 10:50 PM 11/16/2018

## 2018-11-16 NOTE — Transfer of Care (Signed)
Immediate Anesthesia Transfer of Care Note  Patient: [name]  Procedure(s) Performed: Procedure(s): REDUCTION AND REPAIR RIGHT INGUINAL INCARCERATED HERNIA (Right) LAPAROSCOPY DIAGNOSTIC  Patient Location: PACU  Anesthesia Type:General  Level of Consciousness:  sedated, patient cooperative and responds to stimulation  Airway & Oxygen Therapy:Patient Spontanous Breathing and Patient connected to face mask oxgen  Post-op Assessment:  Report given to PACU RN and Post -op Vital signs reviewed and stable  Post vital signs:  Reviewed and stable  Last Vitals:  Vitals:   11/16/18 1941 11/16/18 2000  BP: (!) 155/93 (!) 170/106  Pulse:  89  Resp: 20 (!) 24  Temp:    SpO2:  0000000    Complications: No apparent anesthesia complications

## 2018-11-17 LAB — CBC
HCT: 32.6 % — ABNORMAL LOW (ref 39.0–52.0)
Hemoglobin: 10 g/dL — ABNORMAL LOW (ref 13.0–17.0)
MCH: 31.1 pg (ref 26.0–34.0)
MCHC: 30.7 g/dL (ref 30.0–36.0)
MCV: 101.2 fL — ABNORMAL HIGH (ref 80.0–100.0)
Platelets: 271 10*3/uL (ref 150–400)
RBC: 3.22 MIL/uL — ABNORMAL LOW (ref 4.22–5.81)
RDW: 13.8 % (ref 11.5–15.5)
WBC: 8.2 10*3/uL (ref 4.0–10.5)
nRBC: 0 % (ref 0.0–0.2)

## 2018-11-17 LAB — COMPREHENSIVE METABOLIC PANEL
ALT: 17 U/L (ref 0–44)
AST: 15 U/L (ref 15–41)
Albumin: 3.7 g/dL (ref 3.5–5.0)
Alkaline Phosphatase: 86 U/L (ref 38–126)
Anion gap: 11 (ref 5–15)
BUN: 58 mg/dL — ABNORMAL HIGH (ref 8–23)
CO2: 22 mmol/L (ref 22–32)
Calcium: 9.3 mg/dL (ref 8.9–10.3)
Chloride: 104 mmol/L (ref 98–111)
Creatinine, Ser: 2.4 mg/dL — ABNORMAL HIGH (ref 0.61–1.24)
GFR calc Af Amer: 30 mL/min — ABNORMAL LOW (ref >60)
GFR calc non Af Amer: 26 mL/min — ABNORMAL LOW (ref >60)
Glucose, Bld: 138 mg/dL — ABNORMAL HIGH (ref 70–99)
Potassium: 3.8 mmol/L (ref 3.5–5.1)
Sodium: 137 mmol/L (ref 135–145)
Total Bilirubin: 0.7 mg/dL (ref 0.3–1.2)
Total Protein: 7.3 g/dL (ref 6.5–8.1)

## 2018-11-17 NOTE — Progress Notes (Addendum)
Progress Note: General Surgery Service   Chief Complaint/Subjective: Mr. Dillahunt reports that he is "doing fine" this morning.  He is experiencing some pain at the open hernia repair incision site, but denies generalized abdominal pain.  He also denies nausea or vomiting.  He had one loose bowel movement this morning.  He has been up and moving around his room.    Objective: Vital signs in last 24 hours: Temp:  [97.7 F (36.5 C)-98.5 F (36.9 C)] 98.1 F (36.7 C) (10/04 0401) Pulse Rate:  [86-105] 95 (10/04 0401) Resp:  [12-24] 18 (10/04 0401) BP: (93-170)/(67-106) 130/72 (10/04 0401) SpO2:  [94 %-100 %] 100 % (10/04 0401) Last BM Date: 11/12/18  Intake/Output from previous day: 10/03 0701 - 10/04 0700 In: 2125 [I.V.:1925; IV Piggyback:200] Out: 451 [Emesis/NG output:150; Stool:1; Blood:300] Intake/Output this shift: No intake/output data recorded.  Lungs: Clear to auscultation bilaterally  Cardiovascular: Normal cardiac rate and rhythm  Abd: Abdominal distension, no pain with abdominal palpation.  Bandages clean, dry and intact.  Extremities: No lower extremity edema  Neuro: Alert and oriented x 3  Lab Results: CBC  Recent Labs    11/16/18 1253 11/17/18 0254  WBC 11.1* 8.2  HGB 11.3* 10.0*  HCT 36.0* 32.6*  PLT 352 271   BMET Recent Labs    11/16/18 1253 11/17/18 0254  NA 134* 137  K 3.5 3.8  CL 98 104  CO2 20* 22  GLUCOSE 134* 138*  BUN 55* 58*  CREATININE 2.74* 2.40*  CALCIUM 10.5* 9.3   PT/INR No results for input(s): LABPROT, INR in the last 72 hours. ABG No results for input(s): PHART, HCO3 in the last 72 hours.  Invalid input(s): PCO2, PO2  Studies/Results:  Anti-infectives: Anti-infectives (From admission, onward)    Start     Dose/Rate Route Frequency Ordered Stop   11/16/18 1930  cefoTEtan (CEFOTAN) 2 g in sodium chloride 0.9 % 100 mL IVPB     2 g 200 mL/hr over 30 Minutes Intravenous  Once 11/16/18 1915 11/16/18 2115        Medications: Scheduled Meds:  enoxaparin (LOVENOX) injection  40 mg Subcutaneous Daily   fentaNYL       sodium chloride flush  3 mL Intravenous Once   sodium chloride flush  3 mL Intravenous Once   Continuous Infusions:  dextrose 5 % and 0.45% NaCl 100 mL/hr at 11/17/18 0600   PRN Meds:.acetaminophen **OR** acetaminophen, diphenhydrAMINE **OR** diphenhydrAMINE, metoprolol tartrate, morphine injection, ondansetron **OR** ondansetron (ZOFRAN) IV, oxyCODONE  Assessment/Plan: Patient Active Problem List   Diagnosis Date Noted   Inguinal hernia of right side with obstruction 11/16/2018   Normocytic anemia 06/19/2017   Thrombocytopenia ([place]) 06/19/2017   Occlusion and stenosis of carotid artery without mention of cerebral infarction 06/09/2013   Mr. Funderburk is a 73 yo male 1 day s/p open hernia repair and diagnostic laparoscopy for incarcerated right inguinal hernia.  He has some pain at the incision site, but reports that his generalized abdominal pain, nausea and vomiting are improving.  He had one bowel movement today.  AKI: Identified on ED workup, possibly due to dehydration.  BUN 58, Creatinine 2.40 today.  Patient on IV fluids, will continue to monitor.  VTE: patient encouraged to ambulate around unit.  Lovenox 40 mg, lower extremity compression devices in place.   Diet: Patient is NPO with NG tube for bowel obstruction.  Patient had one small bowel movement this morning.  Consider NG tube removal.   LOS: 1 day  Suzanna Obey, Student-PA Pg# [phone] [facility]

## 2018-11-18 ENCOUNTER — Encounter (HOSPITAL_COMMUNITY): Payer: Self-pay | Admitting: General Surgery

## 2018-11-18 LAB — CBC
HCT: 28.1 % — ABNORMAL LOW (ref 39.0–52.0)
Hemoglobin: 8.8 g/dL — ABNORMAL LOW (ref 13.0–17.0)
MCH: 31.5 pg (ref 26.0–34.0)
MCHC: 31.3 g/dL (ref 30.0–36.0)
MCV: 100.7 fL — ABNORMAL HIGH (ref 80.0–100.0)
Platelets: 233 10*3/uL (ref 150–400)
RBC: 2.79 MIL/uL — ABNORMAL LOW (ref 4.22–5.81)
RDW: 13.9 % (ref 11.5–15.5)
WBC: 5.7 10*3/uL (ref 4.0–10.5)
nRBC: 0 % (ref 0.0–0.2)

## 2018-11-18 LAB — COMPREHENSIVE METABOLIC PANEL
ALT: 13 U/L (ref 0–44)
AST: 13 U/L — ABNORMAL LOW (ref 15–41)
Albumin: 3.2 g/dL — ABNORMAL LOW (ref 3.5–5.0)
Alkaline Phosphatase: 74 U/L (ref 38–126)
Anion gap: 7 (ref 5–15)
BUN: 45 mg/dL — ABNORMAL HIGH (ref 8–23)
CO2: 26 mmol/L (ref 22–32)
Calcium: 8.9 mg/dL (ref 8.9–10.3)
Chloride: 103 mmol/L (ref 98–111)
Creatinine, Ser: 1.6 mg/dL — ABNORMAL HIGH (ref 0.61–1.24)
GFR calc Af Amer: 49 mL/min — ABNORMAL LOW (ref >60)
GFR calc non Af Amer: 42 mL/min — ABNORMAL LOW (ref >60)
Glucose, Bld: 123 mg/dL — ABNORMAL HIGH (ref 70–99)
Potassium: 3.2 mmol/L — ABNORMAL LOW (ref 3.5–5.1)
Sodium: 136 mmol/L (ref 135–145)
Total Bilirubin: 0.8 mg/dL (ref 0.3–1.2)
Total Protein: 6.6 g/dL (ref 6.5–8.1)

## 2018-11-18 NOTE — Anesthesia Postprocedure Evaluation (Signed)
Anesthesia Post Note  Patient: [name]  Procedure(s) Performed: REDUCTION AND REPAIR RIGHT INGUINAL INCARCERATED HERNIA (Right ) LAPAROSCOPY DIAGNOSTIC     Patient location during evaluation: PACU Anesthesia Type: General Level of consciousness: awake and alert Pain management: pain level controlled Vital Signs Assessment: post-procedure vital signs reviewed and stable Respiratory status: spontaneous breathing, nonlabored ventilation, respiratory function stable and patient connected to nasal cannula oxygen Cardiovascular status: blood pressure returned to baseline and stable Postop Assessment: no apparent nausea or vomiting Anesthetic complications: no    Last Vitals:  Vitals:   11/18/18 0120 11/18/18 0440  BP: 121/71 (!) 148/77  Pulse: 95 91  Resp: 18 18  Temp: 37.6 C 37.4 C  SpO2: 95% 96%    Last Pain:  Vitals:   11/18/18 0440  TempSrc: Oral  PainSc:                  [name]

## 2018-11-18 NOTE — Progress Notes (Signed)
2 Days Post-Op   Subjective/Chief Complaint: Pt with some soreness Mobilizing   Objective: Vital signs in last 24 hours: Temp:  [98 F (36.7 C)-99.6 F (37.6 C)] 98.3 F (36.8 C) (10/05 0756) Pulse Rate:  [88-97] 88 (10/05 0756) Resp:  [14-18] 14 (10/05 0756) BP: (117-148)/(66-77) 117/71 (10/05 0756) SpO2:  [95 %-99 %] 95 % (10/05 0756) Weight:  [96 kg] 96 kg (10/05 0446) Last BM Date: 11/17/18  Intake/Output from previous day: 10/04 0701 - 10/05 0700 In: 1960 [P.O.:60; I.V.:1900] Out: 275 [Emesis/NG output:275] Intake/Output this shift: No intake/output data recorded.  Constitutional: No acute distress, conversant, appears states age. Eyes: Anicteric sclerae, moist conjunctiva, no lid lag Lungs: Clear to auscultation bilaterally, normal respiratory effort CV: regular rate and rhythm, no murmurs, no peripheral edema, pedal pulses 2+ GI: Soft, no masses or hepatosplenomegaly, non-tender to palpation, inc c/d/i Skin: No rashes, palpation reveals normal turgor Psychiatric: appropriate judgment and insight, oriented to person, place, and time  Lab Results:  Recent Labs    11/17/18 0254 11/18/18 0326  WBC 8.2 5.7  HGB 10.0* 8.8*  HCT 32.6* 28.1*  PLT 271 233   BMET Recent Labs    11/17/18 0254 11/18/18 0326  NA 137 136  K 3.8 3.2*  CL 104 103  CO2 22 26  GLUCOSE 138* 123*  BUN 58* 45*  CREATININE 2.40* 1.60*  CALCIUM 9.3 8.9   PT/INR No results for input(s): LABPROT, INR in the last 72 hours. ABG No results for input(s): PHART, HCO3 in the last 72 hours.  Invalid input(s): PCO2, PO2  Studies/Results: Ct Abdomen Pelvis Wo Contrast  Result Date: 11/16/2018 CLINICAL DATA:  Abdominal distension and pain. Hernia surgery 10/23/2018. EXAM: CT ABDOMEN AND PELVIS WITHOUT CONTRAST TECHNIQUE: Multidetector CT imaging of the abdomen and pelvis was performed following the standard protocol without IV contrast. COMPARISON:  05/30/2017 CT abdomen/pelvis. FINDINGS:  Lower chest: Mild platelike atelectasis at both lung bases. Few scattered small solid pulmonary nodules at both lung bases, largest 4 mm in the right lower lobe (series 4/image 26), not seen on prior CT. Coronary atherosclerosis Hepatobiliary: Normal liver size. No liver mass. Normal gallbladder with no radiopaque cholelithiasis. No biliary ductal dilatation. Pancreas: Normal, with no mass or duct dilation. Spleen: Normal size. No mass. Adrenals/Urinary Tract: No discrete adrenal nodules. No renal stones. No hydronephrosis. Minimally complex 2.5 cm renal cyst in the posterior upper left kidney with thin mural calcification (series 2/image 40), considered Bosniak 2. no additional contour deforming renal lesions. Normal bladder. Stomach/Bowel: Normal non-distended stomach. There is high-grade sigmoid colon obstruction due to moderate right inguinal hernia containing portion of the sigmoid colon, proximal to which the large bowel is markedly distended with air-fluid levels. Mid to distal small bowel is diffusely dilated up to 6.2 cm diameter with air-fluid levels. No small or large bowel wall thickening or pneumatosis. Normal appendix. Vascular/Lymphatic: Atherosclerotic nonaneurysmal abdominal aorta. No pathologically enlarged lymph nodes in the abdomen or pelvis. Reproductive: Apparent prostatectomy. Other: No pneumoperitoneum, ascites or focal fluid collection. Musculoskeletal: No aggressive appearing focal osseous lesions. Mild-to-moderate thoracolumbar spondylosis. IMPRESSION: 1. High-grade distal colonic obstruction due to incarcerated right inguinal hernia containing a portion of the sigmoid colon. No pneumatosis or pneumoperitoneum. 2. Mild platelike atelectasis at both lung bases. 3. Scattered small solid pulmonary nodules at the lung bases, largest 4 mm, new from prior CT. Recommend follow-up chest CT in 3 months given history of prostate cancer. 4.  Aortic Atherosclerosis (ICD10-I70.0). Electronically Signed    By:  Ilona Sorrel M.D.   On: 11/16/2018 17:52   X-ray Abdomen Ap  Result Date: 11/17/2018 CLINICAL DATA:  NG tube placement EXAM: ABDOMEN - 1 VIEW COMPARISON:  CT 11/16/2018 FINDINGS: Esophageal tube tip is in the left upper quadrant. Dilated small and large bowel loops consistent with distal obstruction on CT. IMPRESSION: 1. Esophageal tube tip overlies the proximal stomach 2. Multiple loops of dilated small and large bowel, corresponding to history of distal colon obstruction Electronically Signed   By: Donavan Foil M.D.   On: 11/17/2018 00:03    Anti-infectives: Anti-infectives (From admission, onward)   Start     Dose/Rate Route Frequency Ordered Stop   11/16/18 1930  cefoTEtan (CEFOTAN) 2 g in sodium chloride 0.9 % 100 mL IVPB     2 g 200 mL/hr over 30 Minutes Intravenous  Once 11/16/18 1915 11/16/18 2115      Assessment/Plan: s/p Procedure(s): REDUCTION AND REPAIR RIGHT INGUINAL INCARCERATED HERNIA (Right) LAPAROSCOPY DIAGNOSTIC -adv diet -mobilize -Plan for home in next 24hrs   LOS: 2 days    Ralene Ok 11/18/2018

## 2018-11-19 LAB — CBC
HCT: 27.5 % — ABNORMAL LOW (ref 39.0–52.0)
Hemoglobin: 8.5 g/dL — ABNORMAL LOW (ref 13.0–17.0)
MCH: 30.9 pg (ref 26.0–34.0)
MCHC: 30.9 g/dL (ref 30.0–36.0)
MCV: 100 fL (ref 80.0–100.0)
Platelets: 200 10*3/uL (ref 150–400)
RBC: 2.75 MIL/uL — ABNORMAL LOW (ref 4.22–5.81)
RDW: 13.6 % (ref 11.5–15.5)
WBC: 6.4 10*3/uL (ref 4.0–10.5)
nRBC: 0 % (ref 0.0–0.2)

## 2018-11-19 LAB — COMPREHENSIVE METABOLIC PANEL
ALT: 11 U/L (ref 0–44)
AST: 12 U/L — ABNORMAL LOW (ref 15–41)
Albumin: 3.1 g/dL — ABNORMAL LOW (ref 3.5–5.0)
Alkaline Phosphatase: 76 U/L (ref 38–126)
Anion gap: 14 (ref 5–15)
BUN: 36 mg/dL — ABNORMAL HIGH (ref 8–23)
CO2: 19 mmol/L — ABNORMAL LOW (ref 22–32)
Calcium: 9 mg/dL (ref 8.9–10.3)
Chloride: 101 mmol/L (ref 98–111)
Creatinine, Ser: 1.37 mg/dL — ABNORMAL HIGH (ref 0.61–1.24)
GFR calc Af Amer: 59 mL/min — ABNORMAL LOW (ref >60)
GFR calc non Af Amer: 51 mL/min — ABNORMAL LOW (ref >60)
Glucose, Bld: 121 mg/dL — ABNORMAL HIGH (ref 70–99)
Potassium: 2.9 mmol/L — ABNORMAL LOW (ref 3.5–5.1)
Sodium: 134 mmol/L — ABNORMAL LOW (ref 135–145)
Total Bilirubin: 0.9 mg/dL (ref 0.3–1.2)
Total Protein: 6.4 g/dL — ABNORMAL LOW (ref 6.5–8.1)

## 2018-11-19 MED ORDER — ACETAMINOPHEN 325 MG PO TABS: 650.0000 mg | ORAL_TABLET | Freq: Four times a day (QID) | ORAL | Status: DC | PRN

## 2018-11-19 MED ORDER — OXYCODONE HCL 5 MG PO TABS: 5.0000 mg | ORAL_TABLET | Freq: Four times a day (QID) | ORAL | 0 refills | Status: DC | PRN

## 2018-11-19 NOTE — Discharge Summary (Addendum)
Patient ID: ARBA WOOTAN [id number] 19-Jun-1945 73 y.o.  Admit date: 11/16/2018 Discharge date: 11/19/2018  Admitting Diagnosis: Right sided incarcerated inguinal hernia Elevated Creatinine   Discharge Diagnosis Patient Active Problem List   Diagnosis Date Noted  . Inguinal hernia of right side with obstruction 11/16/2018  . Normocytic anemia 06/19/2017  . Thrombocytopenia ([place]) 06/19/2017  . Occlusion and stenosis of carotid artery without mention of cerebral infarction 06/09/2013   Consultants None  Procedures 1. Dr. Kieth Brightly - 11/16/2018 - Open Right inguinal hernia repair with mesh for obstructing hernia, diagnostic laparoscopy  Hospital Course:  Mr. Collamore is a 73 yo male with a history of hypertension, hyperlipidemia and who recently underwent a left sided inguinal hernia repair on 10/23/2018 by Dr. Kae Heller who presented to the ED with a 4 day history of right sided abdominal pain, N/V. He reported during the time of admission that he has had a small right sided inguinal hernia for years.  Prior to the surgery on 10/23/2018, he and Dr. Kae Heller discussed a bilateral repair, but patient decided to focus on the left inguinal hernia at that time. ED workup included a CT abdomen at time of admission demonstrated a high grade distal colon obstruction due to incarcerated right inguinal hernia containing sigmoid colon without pneumatosis or pneumoperitoneum. Patient was admitted to the general surgery service and was taken to the OR on 11/16/2018 by Dr. Kieth Brightly as above. The patient tolerated the procedure well and was transferred back to the floor. On POD 1, NGT was removed and patient started on CLD. His diet was advanced and tolerated. On POD 3, the patient was voiding well, tolerating a heart healthy diet, ambulating well, pain well controlled with oral medications, vital signs stable, incisions c/d/i and felt stable for discharge home. To note, patients kidney function was noted to  be elevated during admission. This did improve with IVF to patients baseline from 10/17/2018. Suspect likely due to dehydration from decreased intake and N/V, however, we discussed follow up with his PCP for lab recheck in 1-2 weeks. He was instructed to avoid NSAIDs for pain control and rather use Tylenol and Oxycodone for breakthrough pain. 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 Follow up as below. Information above was taken from chart review.   Physical Exam: Please see progress note from MD's note earlier today.   Allergies as of 11/19/2018   No Known Allergies     Medication List    STOP taking these medications   ibuprofen 200 MG tablet Commonly known as: ADVIL     TAKE these medications   acetaminophen 325 MG tablet Commonly known as: TYLENOL Take 2 tablets (650 mg total) by mouth every 6 (six) hours as needed for mild pain (or temp > 100).   aspirin 81 MG chewable tablet Chew 81 mg by mouth daily.   atorvastatin 10 MG tablet Commonly known as: LIPITOR Take 10 mg by mouth daily.   docusate sodium 100 MG capsule Commonly known as: Colace Take 1 capsule (100 mg total) by mouth 2 (two) times daily.   losartan-hydrochlorothiazide 100-25 MG tablet Commonly known as: HYZAAR Take 0.5 tablets by mouth daily.   oxyCODONE 5 MG immediate release tablet Commonly known as: Oxy IR/ROXICODONE Take 1 tablet (5 mg total) by mouth every 6 (six) hours as needed for breakthrough pain. What changed:   when to take this  reasons to take this  additional instructions  Follow-up Information    Clovis Riley, MD. Call.   Specialty: General Surgery Why: Please call to schedule an appointment for follow up in 2-3 weeks. You will need to arrive 30 minutes prior to your appointment for paperwork. Please bring a copy of your photo ID and insurance card.  Contact information: [address] [place] [address] [phone]         Leighton Ruff, MD. Call.   Specialty: Family Medicine Why: Please call to schedule a follow up appointment for 1-2 weeks for repeat labs (kidney function) Contact information: [place] [address] [phone]           Signed: Alferd Apa, [facility] 11/19/2018, 11:10 AM Pager: [phone]

## 2018-11-19 NOTE — Care Management Important Message (Signed)
Important Message  Patient Details IM Letter Jackson to Velva Harman RN to present to the Patient Name: [name] MRN: [id number] Date of Birth: 1945/06/01   Medicare Important Message Jackson:  Yes     Kerin Salen 11/19/2018, 9:43 AM

## 2018-11-19 NOTE — Progress Notes (Signed)
3 Days Post-Op   Subjective/Chief Complaint: Pt doing well and tol Po   Objective: Vital signs in last 24 hours: Temp:  [98.9 F (37.2 C)-100.3 F (37.9 C)] 98.9 F (37.2 C) (10/06 0535) Pulse Rate:  [85-89] 85 (10/06 0535) Resp:  [16-24] 24 (10/06 0535) BP: (119-128)/(70-85) 119/79 (10/06 0535) SpO2:  [97 %-99 %] 97 % (10/06 0535) Last BM Date: 11/18/18  Intake/Output from previous day: 10/05 0701 - 10/06 0700 In: 1900 [P.O.:700; I.V.:1200] Out: 800 [Urine:800] Intake/Output this shift: No intake/output data recorded.  Constitutional: No acute distress, conversant, appears states age. Eyes: Anicteric sclerae, moist conjunctiva, no lid lag Lungs: Clear to auscultation bilaterally, normal respiratory effort CV: regular rate and rhythm, no murmurs, no peripheral edema, pedal pulses 2+ GI: Soft, no masses or hepatosplenomegaly, non-tender to palpation, inc c/d/i Skin: No rashes, palpation reveals normal turgor Psychiatric: appropriate judgment and insight, oriented to person, place, and time  Lab Results:  Recent Labs    11/18/18 0326 11/19/18 0356  WBC 5.7 6.4  HGB 8.8* 8.5*  HCT 28.1* 27.5*  PLT 233 200   BMET Recent Labs    11/18/18 0326 11/19/18 0356  NA 136 134*  K 3.2* 2.9*  CL 103 101  CO2 26 19*  GLUCOSE 123* 121*  BUN 45* 36*  CREATININE 1.60* 1.37*  CALCIUM 8.9 9.0   PT/INR No results for input(s): LABPROT, INR in the last 72 hours. ABG No results for input(s): PHART, HCO3 in the last 72 hours.  Invalid input(s): PCO2, PO2  Studies/Results: No results found.  Anti-infectives: Anti-infectives (From admission, onward)   Start     Dose/Rate Route Frequency Ordered Stop   11/16/18 1930  cefoTEtan (CEFOTAN) 2 g in sodium chloride 0.9 % 100 mL IVPB     2 g 200 mL/hr over 30 Minutes Intravenous  Once 11/16/18 1915 11/16/18 2115      Assessment/Plan: s/p Procedure(s): REDUCTION AND REPAIR RIGHT INGUINAL INCARCERATED HERNIA  (Right) LAPAROSCOPY DIAGNOSTIC Advance diet  Home later today if tol Breakfast  LOS: 3 days    [name] 11/19/2018

## 2018-11-19 NOTE — Progress Notes (Signed)
Discharge instructions discussed with patient and family, verbalized agreement and understanding 

## 2018-11-19 NOTE — Discharge Instructions (Signed)
[facility]  UMBILICAL OR INGUINAL HERNIA REPAIR: POST OP INSTRUCTIONS  Always review your discharge instruction sheet given to you by the facility where your surgery was performed. IF YOU HAVE DISABILITY OR FAMILY LEAVE FORMS, YOU MUST BRING THEM TO THE OFFICE FOR PROCESSING.   DO NOT GIVE THEM TO YOUR DOCTOR.  1. A  prescription for pain medication may be given to you upon discharge.  Take your pain medication as prescribed, if needed.  If narcotic pain medicine is not needed, then you may take acetaminophen (Tylenol) as  Needed. Your kidney function was noted to be elevated during your hospital stay. This improved. Please follow up with your pcp to have your kidney function rechecked. Do not use Ibuprofen or other NSAIDs for pain (Naproxen, Aleve, Ibuprofen, Motrin etc.) 2. Take your usually prescribed medications unless otherwise directed. If you need a refill on your pain medication, please contact your pharmacy.  They will contact our office to request authorization. Prescriptions will not be filled after 5 pm or on week-ends. 3. You should follow a light diet the first 24 hours after arrival home, such as soup and crackers, etc.  Be sure to include lots of fluids daily.  Resume your normal diet the day after surgery. 4.Most patients will experience some swelling and bruising around the umbilicus or in the groin and scrotum.  Ice packs and reclining will help.  Swelling and bruising can take several days to resolve.  6. It is common to experience some constipation if taking pain medication after surgery.  Increasing fluid intake and taking a stool softener (such as Colace) will usually help or prevent this problem from occurring.  A mild laxative (Milk of Magnesia or Miralax) should be taken according to package directions if there are no bowel movements after 48 hours. 7. Unless discharge instructions indicate otherwise, you may remove your bandages 24-48 hours after  surgery, and you may shower at that time.  You may have steri-strips (small skin tapes) in place directly over the incision.  These strips should be left on the skin for 7-10 days.  If your surgeon used skin glue on the incision, you may shower in 24 hours.  The glue will flake off over the next 2-3 weeks.  Any sutures or staples will be removed at the office during your follow-up visit. 8. ACTIVITIES:  You may resume regular (light) daily activities beginning the next day--such as daily self-care, walking, climbing stairs--gradually increasing activities as tolerated.  You may have sexual intercourse when it is comfortable.  Refrain from any heavy lifting or straining until approved by your doctor. Do not lift anything greater than 15lbs until you are cleared to do so by your doctor.   a.You may drive when you are no longer taking prescription pain medication, you can comfortably wear a seatbelt, and you can safely maneuver your car and apply brakes. b.RETURN TO WORK:  When you are cleared by your surgeon   9.You should see your doctor in the office for a follow-up appointment approximately 2-3 weeks after your surgery.  Make sure that you call for this appointment within a day or two after you arrive home to insure a convenient appointment time. 10.OTHER INSTRUCTIONS: _________________________    _____________________________________  WHEN TO CALL YOUR DOCTOR: 1. Fever over 101.0 2. Inability to urinate 3. Nausea and/or vomiting 4. Extreme swelling or bruising 5. Continued bleeding from incision. 6. Increased pain, redness, or drainage from the incision  The  clinic staff is available to answer your questions during regular business hours.  Please dont hesitate to call and ask to speak to one of the nurses for clinical concerns.  If you have a medical emergency, go to the nearest emergency room or call 911.  A surgeon from [facility] is always on call at the hospital   [address] [place], [place], [place]  [address] ?  [address], [place], [place]   [address] [phone] ? [phone] ? FAX [phone] Web site: [email or web address]

## 2018-11-26 DIAGNOSIS — N183 Chronic kidney disease, stage 3 unspecified: Secondary | ICD-10-CM | POA: Diagnosis not present

## 2018-11-26 DIAGNOSIS — D649 Anemia, unspecified: Secondary | ICD-10-CM | POA: Diagnosis not present

## 2018-11-26 DIAGNOSIS — Z9889 Other specified postprocedural states: Secondary | ICD-10-CM | POA: Diagnosis not present

## 2018-11-26 DIAGNOSIS — E876 Hypokalemia: Secondary | ICD-10-CM | POA: Diagnosis not present

## 2018-11-27 DIAGNOSIS — N183 Chronic kidney disease, stage 3 unspecified: Secondary | ICD-10-CM | POA: Diagnosis not present

## 2018-11-27 DIAGNOSIS — D649 Anemia, unspecified: Secondary | ICD-10-CM | POA: Diagnosis not present

## 2018-11-27 DIAGNOSIS — E876 Hypokalemia: Secondary | ICD-10-CM | POA: Diagnosis not present

## 2018-12-03 DIAGNOSIS — N183 Chronic kidney disease, stage 3 unspecified: Secondary | ICD-10-CM | POA: Diagnosis not present

## 2018-12-03 DIAGNOSIS — D649 Anemia, unspecified: Secondary | ICD-10-CM | POA: Diagnosis not present

## 2018-12-10 ENCOUNTER — Telehealth: Payer: Self-pay | Admitting: Hematology and Oncology

## 2018-12-10 NOTE — Telephone Encounter (Signed)
Scheduled appt per 10/27 sch message - pt is aware of appt date and time.   

## 2018-12-25 ENCOUNTER — Other Ambulatory Visit: Payer: Self-pay

## 2018-12-25 DIAGNOSIS — D696 Thrombocytopenia, unspecified: Secondary | ICD-10-CM

## 2018-12-25 DIAGNOSIS — D649 Anemia, unspecified: Secondary | ICD-10-CM

## 2018-12-25 NOTE — Progress Notes (Signed)
Patient Care Team: Leighton Ruff, MD as PCP - General (Family Medicine)  DIAGNOSIS:    ICD-10-CM   1. Normocytic anemia  D64.9     CHIEF COMPLIANT: Follow-up of mild anemia and thrombocytopenia  INTERVAL HISTORY: [name] is a 73 y.o. with above-mentioned history of mild anemia and thrombocytopenia. I last saw him 1.5 years ago. Labs on 12/03/18 showed: Hg 8.9, HCT 27.9, MCV 97.6, platelets 371, ferritin 265. He presents to the clinic today for follow-up.  He had to abdominal surgeries in October.  First was a left-sided hernia surgery and very shortly thereafter he had an incarcerated right inguinal hernia which also had to be urgently repaired.  During the surgical time his hemoglobin had dropped to 8.4.  He tells me that he did not feel any symptoms of anemia like fatigue lightheadedness or shortness of breath.  He is very pleased with his surgical outcome and he is no longer in pain or discomfort.  His hemoglobin today is at 10.  REVIEW OF SYSTEMS:   Constitutional: Denies fevers, chills or abnormal weight loss Eyes: Denies blurriness of vision Ears, nose, mouth, throat, and face: Denies mucositis or sore throat Respiratory: Denies cough, dyspnea or wheezes Cardiovascular: Denies palpitation, chest discomfort Gastrointestinal: Denies nausea, heartburn or change in bowel habits Skin: Denies abnormal skin rashes Lymphatics: Denies new lymphadenopathy or easy bruising Neurological: Denies numbness, tingling or new weaknesses Behavioral/Psych: Mood is stable, no new changes  Extremities: No lower extremity edema Breast: denies any pain or lumps or nodules in either breasts All other systems were reviewed with the patient and are negative.  I have reviewed the past medical history, past surgical history, social history and family history with the patient and they are unchanged from previous note.  ALLERGIES:  has No Known Allergies.  MEDICATIONS:  Current Outpatient  Medications  Medication Sig Dispense Refill  . aspirin 81 MG chewable tablet Chew 81 mg by mouth daily.     Marland Kitchen atorvastatin (LIPITOR) 10 MG tablet Take 10 mg by mouth daily.    Marland Kitchen losartan-hydrochlorothiazide (HYZAAR) 100-25 MG tablet Take 0.5 tablets by mouth daily.      No current facility-administered medications for this visit.     PHYSICAL EXAMINATION: ECOG PERFORMANCE STATUS: 1 - Symptomatic but completely ambulatory  Vitals:   12/26/18 1455  BP: 136/70  Pulse: 73  Resp: 17  Temp: 97.9 F (36.6 C)  SpO2: 100%   Filed Weights   12/26/18 1455  Weight: 211 lb 6.4 oz (95.9 kg)    GENERAL: alert, no distress and comfortable SKIN: skin color, texture, turgor are normal, no rashes or significant lesions EYES: normal, Conjunctiva are pink and non-injected, sclera clear OROPHARYNX: no exudate, no erythema and lips, buccal mucosa, and tongue normal  NECK: supple, thyroid normal size, non-tender, without nodularity LYMPH: no palpable lymphadenopathy in the cervical, axillary or inguinal LUNGS: clear to auscultation and percussion with normal breathing effort HEART: regular rate & rhythm and no murmurs and no lower extremity edema ABDOMEN: abdomen soft, non-tender and normal bowel sounds MUSCULOSKELETAL: no cyanosis of digits and no clubbing  NEURO: alert & oriented x 3 with fluent speech, no focal motor/sensory deficits EXTREMITIES: No lower extremity edema  LABORATORY DATA:  I have reviewed the data as listed CMP Latest Ref Rng & Units 11/19/2018 11/18/2018 11/17/2018  Glucose 70 - 99 mg/dL 121(H) 123(H) 138(H)  BUN 8 - 23 mg/dL 36(H) 45(H) 58(H)  Creatinine 0.61 - 1.24 mg/dL 1.37(H) 1.60(H) 2.40(H)  Sodium 135 - 145 mmol/L 134(L) 136 137  Potassium 3.5 - 5.1 mmol/L 2.9(L) 3.2(L) 3.8  Chloride 98 - 111 mmol/L 101 103 104  CO2 22 - 32 mmol/L 19(L) 26 22  Calcium 8.9 - 10.3 mg/dL 9.0 8.9 9.3  Total Protein 6.5 - 8.1 g/dL 6.4(L) 6.6 7.3  Total Bilirubin 0.3 - 1.2 mg/dL 0.9 0.8  0.7  Alkaline Phos 38 - 126 U/L 76 74 86  AST 15 - 41 U/L 12(L) 13(L) 15  ALT 0 - 44 U/L 11 13 17     Lab Results  Component Value Date   WBC 7.0 12/26/2018   HGB 10.0 (L) 12/26/2018   HCT 32.3 (L) 12/26/2018   MCV 100.6 (H) 12/26/2018   PLT 163 12/26/2018   NEUTROABS 2.9 12/26/2018    ASSESSMENT & PLAN:  Normocytic anemia Lab review: 12/26/2018: Hemoglobin 10 11/27/2018: Hemoglobin 8.4, MCV 96.6, platelets 232 (patient had 2 surgeries within a month for hernias) 05/10/2017: Hemoglobin 12.4, MCV 100.3, platelets 114 05/07/2017: Hemoglobin 12.5, MCV 99.6, platelets 95 05/08/2016: Hemoglobin 12.9, MCV 97.6, platelets 131 05/01/2016: Hemoglobin 12.5, MCV 96.7, platelets not reported 05/24/2015: Hemoglobin 13.6, MCV 98.7, platelets 193  I reviewed today's blood work.  I discussed with him that the reason for his anemia throughout October was blood loss related anemia.  His CBC is already starting to improve.  However his baseline is around 12.  We will recheck his CBC in 3 months and make a decision about whether or not he needs any interventions from [place].  Prior history of thrombocytopenia: Platelet counts are normal.  Return to clinic in 3 months with labs and follow-up.    No orders of the defined types were placed in this encounter.  The patient has a good understanding of the overall plan. he agrees with it. he will call with any problems that may develop before the next visit here.  Nicholas Lose, MD 12/26/2018  Julious Oka Dorshimer am acting as scribe for Dr. Nicholas Lose.  I have reviewed the above documentation for accuracy and completeness, and I agree with the above.

## 2018-12-26 ENCOUNTER — Inpatient Hospital Stay: Payer: PPO | Attending: Hematology and Oncology | Admitting: Hematology and Oncology

## 2018-12-26 ENCOUNTER — Inpatient Hospital Stay: Payer: PPO

## 2018-12-26 ENCOUNTER — Other Ambulatory Visit: Payer: Self-pay

## 2018-12-26 DIAGNOSIS — D649 Anemia, unspecified: Secondary | ICD-10-CM

## 2018-12-26 DIAGNOSIS — D500 Iron deficiency anemia secondary to blood loss (chronic): Secondary | ICD-10-CM | POA: Diagnosis not present

## 2018-12-26 DIAGNOSIS — D696 Thrombocytopenia, unspecified: Secondary | ICD-10-CM

## 2018-12-26 LAB — CBC WITH DIFFERENTIAL (CANCER CENTER ONLY)
Abs Immature Granulocytes: 0.02 10*3/uL (ref 0.00–0.07)
Basophils Absolute: 0 10*3/uL (ref 0.0–0.1)
Basophils Relative: 0 %
Eosinophils Absolute: 0.3 10*3/uL (ref 0.0–0.5)
Eosinophils Relative: 4 %
HCT: 32.3 % — ABNORMAL LOW (ref 39.0–52.0)
Hemoglobin: 10 g/dL — ABNORMAL LOW (ref 13.0–17.0)
Immature Granulocytes: 0 %
Lymphocytes Relative: 47 %
Lymphs Abs: 3.3 10*3/uL (ref 0.7–4.0)
MCH: 31.2 pg (ref 26.0–34.0)
MCHC: 31 g/dL (ref 30.0–36.0)
MCV: 100.6 fL — ABNORMAL HIGH (ref 80.0–100.0)
Monocytes Absolute: 0.5 10*3/uL (ref 0.1–1.0)
Monocytes Relative: 7 %
Neutro Abs: 2.9 10*3/uL (ref 1.7–7.7)
Neutrophils Relative %: 42 %
Platelet Count: 163 10*3/uL (ref 150–400)
RBC: 3.21 MIL/uL — ABNORMAL LOW (ref 4.22–5.81)
RDW: 13.3 % (ref 11.5–15.5)
WBC Count: 7 10*3/uL (ref 4.0–10.5)
nRBC: 0 % (ref 0.0–0.2)

## 2018-12-26 NOTE — Assessment & Plan Note (Signed)
Lab review: 12/26/2018: Hemoglobin 10 11/27/2018: Hemoglobin 8.4, MCV 96.6, platelets 232 (patient had 2 surgeries within a month for hernias) 05/10/2017: Hemoglobin 12.4, MCV 100.3, platelets 114 05/07/2017: Hemoglobin 12.5, MCV 99.6, platelets 95 05/08/2016: Hemoglobin 12.9, MCV 97.6, platelets 131 05/01/2016: Hemoglobin 12.5, MCV 96.7, platelets not reported 05/24/2015: Hemoglobin 13.6, MCV 98.7, platelets 193  I reviewed today's blood work.  I discussed with him that the reason for his anemia throughout October was blood loss related anemia.  His CBC is already starting to improve.  However his baseline is around 12.  We will recheck his CBC in 3 months and make a decision about whether or not he needs any interventions from [place].  Prior history of thrombocytopenia: Platelet counts are normal.  Return to clinic in 3 months with labs and follow-up.

## 2019-02-24 DIAGNOSIS — R9721 Rising PSA following treatment for malignant neoplasm of prostate: Secondary | ICD-10-CM | POA: Diagnosis not present

## 2019-03-03 DIAGNOSIS — C61 Malignant neoplasm of prostate: Secondary | ICD-10-CM | POA: Diagnosis not present

## 2019-03-12 ENCOUNTER — Telehealth: Payer: Self-pay | Admitting: Hematology and Oncology

## 2019-03-12 NOTE — Telephone Encounter (Signed)
Rescheduled per MD. Hulen Skains and left a msg. Mailing printout

## 2019-03-31 ENCOUNTER — Other Ambulatory Visit: Payer: PPO

## 2019-03-31 ENCOUNTER — Ambulatory Visit: Payer: PPO | Admitting: Hematology and Oncology

## 2019-04-08 ENCOUNTER — Inpatient Hospital Stay: Payer: PPO

## 2019-04-08 ENCOUNTER — Telehealth: Payer: Self-pay | Admitting: Hematology and Oncology

## 2019-04-08 ENCOUNTER — Inpatient Hospital Stay: Payer: PPO | Admitting: Hematology and Oncology

## 2019-04-08 NOTE — Telephone Encounter (Signed)
Rescheduled per 2/22 sch msg, pt req. Called and spoke with pt, confirmed 3/9 appt

## 2019-04-08 NOTE — Assessment & Plan Note (Deleted)
Lab review: 12/26/2018: Hemoglobin 10 11/27/2018: Hemoglobin 8.4, MCV 96.6, platelets 232 (patient had 2 surgeries within a month for hernias) 05/10/2017: Hemoglobin 12.4, MCV 100.3, platelets 114 05/07/2017: Hemoglobin 12.5, MCV 99.6, platelets 95 05/08/2016: Hemoglobin 12.9, MCV 97.6, platelets 131 05/01/2016: Hemoglobin 12.5, MCV 96.7, platelets not reported 05/24/2015: Hemoglobin 13.6, MCV 98.7, platelets 193  I reviewed today's blood work.  I discussed with him that the reason for his anemia throughout October was blood loss related anemia.  His CBC is already starting to improve.  However his baseline is around 12.  We will recheck his CBC in 3 months and make a decision about whether or not he needs any interventions from [place].  Prior history of thrombocytopenia: Platelet counts are normal.  Return to clinic in 3 months with labs and follow-up.

## 2019-04-22 ENCOUNTER — Ambulatory Visit: Payer: PPO | Admitting: Hematology and Oncology

## 2019-04-22 ENCOUNTER — Other Ambulatory Visit: Payer: PPO

## 2019-05-04 NOTE — Progress Notes (Signed)
   Patient Care Team: Leighton Ruff, MD as PCP - General (Family Medicine)  DIAGNOSIS:    ICD-10-CM   1. Normocytic anemia  D64.9   2. Thrombocytopenia (HCC)  D69.6     CHIEF COMPLIANT: Follow-up of mild anemia and thrombocytopenia  INTERVAL HISTORY: [name] is a 74 y.o. with above-mentioned history of mild anemia and thrombocytopenia. He presents to the clinic today for follow-up.  ALLERGIES:  has No Known Allergies.  MEDICATIONS:  Current Outpatient Medications  Medication Sig Dispense Refill  . aspirin 81 MG chewable tablet Chew 81 mg by mouth daily.     Marland Kitchen atorvastatin (LIPITOR) 10 MG tablet Take 10 mg by mouth daily.    Marland Kitchen losartan-hydrochlorothiazide (HYZAAR) 100-25 MG tablet Take 0.5 tablets by mouth daily.      No current facility-administered medications for this visit.    PHYSICAL EXAMINATION: ECOG PERFORMANCE STATUS: 1 - Symptomatic but completely ambulatory  Vitals:   05/05/19 1532  BP: 138/87  Pulse: 71  Resp: 17  Temp: 98.3 F (36.8 C)  SpO2: 100%   Filed Weights   05/05/19 1532  Weight: 218 lb 1.6 oz (98.9 kg)    LABORATORY DATA:  I have reviewed the data as listed CMP Latest Ref Rng & Units 11/19/2018 11/18/2018 11/17/2018  Glucose 70 - 99 mg/dL 121(H) 123(H) 138(H)  BUN 8 - 23 mg/dL 36(H) 45(H) 58(H)  Creatinine 0.61 - 1.24 mg/dL 1.37(H) 1.60(H) 2.40(H)  Sodium 135 - 145 mmol/L 134(L) 136 137  Potassium 3.5 - 5.1 mmol/L 2.9(L) 3.2(L) 3.8  Chloride 98 - 111 mmol/L 101 103 104  CO2 22 - 32 mmol/L 19(L) 26 22  Calcium 8.9 - 10.3 mg/dL 9.0 8.9 9.3  Total Protein 6.5 - 8.1 g/dL 6.4(L) 6.6 7.3  Total Bilirubin 0.3 - 1.2 mg/dL 0.9 0.8 0.7  Alkaline Phos 38 - 126 U/L 76 74 86  AST 15 - 41 U/L 12(L) 13(L) 15  ALT 0 - 44 U/L 11 13 17     Lab Results  Component Value Date   WBC 7.7 05/05/2019   HGB 10.4 (L) 05/05/2019   HCT 32.5 (L) 05/05/2019   MCV 99.4 05/05/2019   PLT 193 05/05/2019   NEUTROABS 3.5 05/05/2019    ASSESSMENT & PLAN:    Normocytic anemia Lab review: 05/05/2019: Hemoglobin 10.4, platelets 193 12/26/2018: Hemoglobin 10 11/27/2018: Hemoglobin 8.4, MCV 96.6, platelets 232 (patient had 2 surgeries within a month for hernias) 05/07/2017: Hemoglobin 12.5, MCV 99.6, platelets 95 05/08/2016: Hemoglobin 12.9, MCV 97.6, platelets 131 05/24/2015: Hemoglobin 13.6, MCV 98.7, platelets 193  Iron studies are pending. I believe the cause of his anemia is multifactorial. His hemoglobin appears to be improving slowly over time.  Therefore we did not plan to do a bone marrow at this time. Return to clinic in 1 year for follow-up    No orders of the defined types were placed in this encounter.  The patient has a good understanding of the overall plan. he agrees with it. he will call with any problems that may develop before the next visit here.  Total time spent: 20 mins including face to face time and time spent for planning, charting and coordination of care  Nicholas Lose, MD 05/05/2019  I, Cloyde Reams Dorshimer, am acting as scribe for Dr. Nicholas Lose.  I have reviewed the above documentation for accuracy and completeness, and I agree with the above.

## 2019-05-05 ENCOUNTER — Inpatient Hospital Stay: Payer: PPO | Attending: Hematology and Oncology

## 2019-05-05 ENCOUNTER — Other Ambulatory Visit: Payer: Self-pay

## 2019-05-05 ENCOUNTER — Inpatient Hospital Stay (HOSPITAL_BASED_OUTPATIENT_CLINIC_OR_DEPARTMENT_OTHER): Payer: PPO | Admitting: Hematology and Oncology

## 2019-05-05 VITALS — BP 138/87 | HR 71 | Temp 98.3°F | Resp 17 | Ht 70.0 in | Wt 218.1 lb

## 2019-05-05 DIAGNOSIS — D649 Anemia, unspecified: Secondary | ICD-10-CM

## 2019-05-05 DIAGNOSIS — Z7982 Long term (current) use of aspirin: Secondary | ICD-10-CM | POA: Diagnosis not present

## 2019-05-05 DIAGNOSIS — D696 Thrombocytopenia, unspecified: Secondary | ICD-10-CM | POA: Diagnosis not present

## 2019-05-05 DIAGNOSIS — Z79899 Other long term (current) drug therapy: Secondary | ICD-10-CM | POA: Diagnosis not present

## 2019-05-05 LAB — CBC WITH DIFFERENTIAL (CANCER CENTER ONLY)
Abs Immature Granulocytes: 0.02 10*3/uL (ref 0.00–0.07)
Basophils Absolute: 0 10*3/uL (ref 0.0–0.1)
Basophils Relative: 0 %
Eosinophils Absolute: 0.2 10*3/uL (ref 0.0–0.5)
Eosinophils Relative: 2 %
HCT: 32.5 % — ABNORMAL LOW (ref 39.0–52.0)
Hemoglobin: 10.4 g/dL — ABNORMAL LOW (ref 13.0–17.0)
Immature Granulocytes: 0 %
Lymphocytes Relative: 44 %
Lymphs Abs: 3.3 10*3/uL (ref 0.7–4.0)
MCH: 31.8 pg (ref 26.0–34.0)
MCHC: 32 g/dL (ref 30.0–36.0)
MCV: 99.4 fL (ref 80.0–100.0)
Monocytes Absolute: 0.6 10*3/uL (ref 0.1–1.0)
Monocytes Relative: 8 %
Neutro Abs: 3.5 10*3/uL (ref 1.7–7.7)
Neutrophils Relative %: 46 %
Platelet Count: 193 10*3/uL (ref 150–400)
RBC: 3.27 MIL/uL — ABNORMAL LOW (ref 4.22–5.81)
RDW: 12.9 % (ref 11.5–15.5)
WBC Count: 7.7 10*3/uL (ref 4.0–10.5)
nRBC: 0 % (ref 0.0–0.2)

## 2019-05-05 LAB — VITAMIN B12: Vitamin B-12: 283 pg/mL (ref 180–914)

## 2019-05-05 LAB — FOLATE: Folate: 6 ng/mL (ref >5.9)

## 2019-05-05 NOTE — Assessment & Plan Note (Signed)
Lab review: 05/05/2019: 12/26/2018: Hemoglobin 10 11/27/2018: Hemoglobin 8.4, MCV 96.6, platelets 232 (patient had 2 surgeries within a month for hernias) 05/10/2017: Hemoglobin 12.4, MCV 100.3, platelets 114 05/07/2017: Hemoglobin 12.5, MCV 99.6, platelets 95 05/08/2016: Hemoglobin 12.9, MCV 97.6, platelets 131 05/01/2016: Hemoglobin 12.5, MCV 96.7, platelets not reported 05/24/2015: Hemoglobin 13.6, MCV 98.7, platelets 193  Based on normalization of blood counts, no further work-up is necessary. Return to clinic on an as-needed basis.

## 2019-05-06 ENCOUNTER — Telehealth: Payer: Self-pay | Admitting: Hematology and Oncology

## 2019-05-06 LAB — IRON AND TIBC
Iron: 75 ug/dL (ref 42–163)
Saturation Ratios: 26 % (ref 20–55)
TIBC: 283 ug/dL (ref 202–409)
UIBC: 208 ug/dL (ref 117–376)

## 2019-05-06 LAB — FERRITIN: Ferritin: 158 ng/mL (ref 24–336)

## 2019-05-06 NOTE — Telephone Encounter (Signed)
Scheduled per 03/22 los, patient has been called and voicemail was left. 

## 2019-05-22 DIAGNOSIS — F411 Generalized anxiety disorder: Secondary | ICD-10-CM | POA: Diagnosis not present

## 2019-05-22 DIAGNOSIS — I10 Essential (primary) hypertension: Secondary | ICD-10-CM | POA: Diagnosis not present

## 2019-05-22 DIAGNOSIS — Z0000 Encounter for general adult medical examination without abnormal findings: Secondary | ICD-10-CM | POA: Diagnosis not present

## 2019-05-22 DIAGNOSIS — N183 Chronic kidney disease, stage 3 unspecified: Secondary | ICD-10-CM | POA: Diagnosis not present

## 2019-05-22 DIAGNOSIS — D649 Anemia, unspecified: Secondary | ICD-10-CM | POA: Diagnosis not present

## 2019-05-22 DIAGNOSIS — E7800 Pure hypercholesterolemia, unspecified: Secondary | ICD-10-CM | POA: Diagnosis not present

## 2019-05-22 DIAGNOSIS — C61 Malignant neoplasm of prostate: Secondary | ICD-10-CM | POA: Diagnosis not present

## 2019-06-04 DIAGNOSIS — R197 Diarrhea, unspecified: Secondary | ICD-10-CM | POA: Diagnosis not present

## 2019-06-04 DIAGNOSIS — J069 Acute upper respiratory infection, unspecified: Secondary | ICD-10-CM | POA: Diagnosis not present

## 2019-06-04 DIAGNOSIS — R040 Epistaxis: Secondary | ICD-10-CM | POA: Diagnosis not present

## 2019-06-30 DIAGNOSIS — C61 Malignant neoplasm of prostate: Secondary | ICD-10-CM | POA: Diagnosis not present

## 2019-07-07 DIAGNOSIS — C61 Malignant neoplasm of prostate: Secondary | ICD-10-CM | POA: Diagnosis not present

## 2019-07-08 ENCOUNTER — Other Ambulatory Visit: Payer: Self-pay | Admitting: Urology

## 2019-07-08 DIAGNOSIS — C61 Malignant neoplasm of prostate: Secondary | ICD-10-CM

## 2019-08-15 ENCOUNTER — Ambulatory Visit
Admission: RE | Admit: 2019-08-15 | Discharge: 2019-08-15 | Disposition: A | Discharge status: Home / Self Care | Payer: PPO | Source: Ambulatory Visit | Attending: Urology | Admitting: Urology

## 2019-08-15 ENCOUNTER — Other Ambulatory Visit: Payer: Self-pay

## 2019-08-15 DIAGNOSIS — Z0389 Encounter for observation for other suspected diseases and conditions ruled out: Secondary | ICD-10-CM | POA: Diagnosis not present

## 2019-08-15 DIAGNOSIS — C61 Malignant neoplasm of prostate: Secondary | ICD-10-CM

## 2019-09-16 DIAGNOSIS — R509 Fever, unspecified: Secondary | ICD-10-CM | POA: Diagnosis not present

## 2019-09-16 DIAGNOSIS — Z03818 Encounter for observation for suspected exposure to other biological agents ruled out: Secondary | ICD-10-CM | POA: Diagnosis not present

## 2019-09-16 DIAGNOSIS — R0981 Nasal congestion: Secondary | ICD-10-CM | POA: Diagnosis not present

## 2019-11-03 DIAGNOSIS — C61 Malignant neoplasm of prostate: Secondary | ICD-10-CM | POA: Diagnosis not present

## 2019-11-10 DIAGNOSIS — C61 Malignant neoplasm of prostate: Secondary | ICD-10-CM | POA: Diagnosis not present

## 2019-11-10 DIAGNOSIS — R8279 Other abnormal findings on microbiological examination of urine: Secondary | ICD-10-CM | POA: Diagnosis not present

## 2020-02-26 DIAGNOSIS — C61 Malignant neoplasm of prostate: Secondary | ICD-10-CM | POA: Diagnosis not present

## 2020-03-04 DIAGNOSIS — Z5111 Encounter for antineoplastic chemotherapy: Secondary | ICD-10-CM | POA: Diagnosis not present

## 2020-03-04 DIAGNOSIS — C61 Malignant neoplasm of prostate: Secondary | ICD-10-CM | POA: Diagnosis not present

## 2020-03-25 DIAGNOSIS — B029 Zoster without complications: Secondary | ICD-10-CM | POA: Diagnosis not present

## 2020-03-29 ENCOUNTER — Encounter: Payer: Self-pay | Admitting: Gastroenterology

## 2020-04-30 ENCOUNTER — Telehealth: Payer: Self-pay | Admitting: Hematology and Oncology

## 2020-04-30 NOTE — Telephone Encounter (Signed)
Cancelled per 03/18 scheduled message per patient's request, left a voicemail to reschedule.

## 2020-05-04 ENCOUNTER — Other Ambulatory Visit: Payer: PPO

## 2020-05-24 DIAGNOSIS — Z0000 Encounter for general adult medical examination without abnormal findings: Secondary | ICD-10-CM | POA: Diagnosis not present

## 2020-05-24 DIAGNOSIS — I10 Essential (primary) hypertension: Secondary | ICD-10-CM | POA: Diagnosis not present

## 2020-05-24 DIAGNOSIS — D649 Anemia, unspecified: Secondary | ICD-10-CM | POA: Diagnosis not present

## 2020-05-24 DIAGNOSIS — Z1159 Encounter for screening for other viral diseases: Secondary | ICD-10-CM | POA: Diagnosis not present

## 2020-05-24 DIAGNOSIS — N183 Chronic kidney disease, stage 3 unspecified: Secondary | ICD-10-CM | POA: Diagnosis not present

## 2020-05-24 DIAGNOSIS — C61 Malignant neoplasm of prostate: Secondary | ICD-10-CM | POA: Diagnosis not present

## 2020-05-24 DIAGNOSIS — E7800 Pure hypercholesterolemia, unspecified: Secondary | ICD-10-CM | POA: Diagnosis not present

## 2020-05-24 DIAGNOSIS — I6529 Occlusion and stenosis of unspecified carotid artery: Secondary | ICD-10-CM | POA: Diagnosis not present

## 2020-05-25 ENCOUNTER — Telehealth: Payer: Self-pay | Admitting: Hematology and Oncology

## 2020-05-25 NOTE — Telephone Encounter (Signed)
Scheduled appt per 4/12 sch msg. Pt aware.  

## 2020-06-24 DIAGNOSIS — C61 Malignant neoplasm of prostate: Secondary | ICD-10-CM | POA: Diagnosis not present

## 2020-06-26 DIAGNOSIS — R059 Cough, unspecified: Secondary | ICD-10-CM | POA: Diagnosis not present

## 2020-06-27 NOTE — Progress Notes (Signed)
Patient Care Team: Leighton Ruff, MD as PCP - General (Family Medicine)  DIAGNOSIS:    ICD-10-CM   1. Normocytic anemia  D64.9 CBC with Differential ([facility])    Iron and TIBC    Ferritin    Vitamin B12    Folate    Retic Panel    CHIEF COMPLIANT: Follow-up of mild anemia  INTERVAL HISTORY: [name] is a 75 y.o. with above-mentioned history of mild anemia. He presents to the clinic todayfor follow-up.  He reports no new health issues or concerns.  He has been feeling pretty good.  Recently his blood work revealed hemoglobin of 9 and therefore he was referred back to see [place].  ALLERGIES:  has No Known Allergies.  MEDICATIONS:  Current Outpatient Medications  Medication Sig Dispense Refill  . aspirin 81 MG chewable tablet Chew 81 mg by mouth daily.     Marland Kitchen atorvastatin (LIPITOR) 10 MG tablet Take 10 mg by mouth daily.    Marland Kitchen losartan-hydrochlorothiazide (HYZAAR) 100-25 MG tablet Take 0.5 tablets by mouth daily.      No current facility-administered medications for this visit.    PHYSICAL EXAMINATION: ECOG PERFORMANCE STATUS: 1 - Symptomatic but completely ambulatory  Vitals:   06/28/20 1529  BP: (!) 197/87  Pulse: 78  Resp: 18  Temp: (!) 97.5 F (36.4 C)  SpO2: 100%   Filed Weights   06/28/20 1529  Weight: 222 lb 3.2 oz (100.8 kg)    LABORATORY DATA:  I have reviewed the data as listed CMP Latest Ref Rng & Units 11/19/2018 11/18/2018 11/17/2018  Glucose 70 - 99 mg/dL 121(H) 123(H) 138(H)  BUN 8 - 23 mg/dL 36(H) 45(H) 58(H)  Creatinine 0.61 - 1.24 mg/dL 1.37(H) 1.60(H) 2.40(H)  Sodium 135 - 145 mmol/L 134(L) 136 137  Potassium 3.5 - 5.1 mmol/L 2.9(L) 3.2(L) 3.8  Chloride 98 - 111 mmol/L 101 103 104  CO2 22 - 32 mmol/L 19(L) 26 22  Calcium 8.9 - 10.3 mg/dL 9.0 8.9 9.3  Total Protein 6.5 - 8.1 g/dL 6.4(L) 6.6 7.3  Total Bilirubin 0.3 - 1.2 mg/dL 0.9 0.8 0.7  Alkaline Phos 38 - 126 U/L 76 74 86  AST 15 - 41 U/L 12(L) 13(L) 15  ALT 0 - 44 U/L '11  13 17    ' Lab Results  Component Value Date   WBC 6.6 06/28/2020   HGB 9.5 (L) 06/28/2020   HCT 29.3 (L) 06/28/2020   MCV 98.7 06/28/2020   PLT 184 06/28/2020   NEUTROABS 3.6 06/28/2020    ASSESSMENT & PLAN:  Normocytic anemia Lab review: 06/28/2020: Hemoglobin 9.5, MCV 98.7, WBC 6.6, platelets 184 05/25/2020: Hemoglobin 9, MCV 96.2, RDW 13.9, platelets 242 05/05/2019: Hemoglobin 10.4, platelets 193 12/26/2018: Hemoglobin 10 11/27/2018: Hemoglobin 8.4, MCV 96.6, platelets 232 (patient had 2 surgeries within a month for hernias) 05/07/2017: Hemoglobin 12.5, MCV 99.6, platelets 95 05/08/2016: Hemoglobin 12.9, MCV 97.6, platelets 131 05/24/2015: Hemoglobin 13.6, MCV 98.7, platelets 193  Iron studies are pending. I believe the cause of his anemia is multifactorial.  I discussed with the patient that his hemoglobin appears to have slightly improved compared to 05/25/2020 or it could be lab variability. Either way patient is asymptomatic.  Therefore I decided not to perform bone marrow biopsy but to watch and monitor his counts. Return to clinic in 2 months with labs and follow-up.     Orders Placed This Encounter  Procedures  . CBC with Differential ([place])  Standing Status:   Future    Number of Occurrences:   1    Standing Expiration Date:   06/28/2021  . Iron and TIBC    Standing Status:   Future    Number of Occurrences:   1    Standing Expiration Date:   06/28/2021  . Ferritin    Standing Status:   Future    Number of Occurrences:   1    Standing Expiration Date:   06/28/2021  . Vitamin B12    Standing Status:   Future    Number of Occurrences:   1    Standing Expiration Date:   06/28/2021  . Folate    Standing Status:   Future    Number of Occurrences:   1    Standing Expiration Date:   06/28/2021  . Retic Panel    Standing Status:   Future    Number of Occurrences:   1    Standing Expiration Date:   06/28/2021   The patient has a good understanding  of the overall plan. he agrees with it. he will call with any problems that may develop before the next visit here.  Total time spent: 20 mins including face to face time and time spent for planning, charting and coordination of care  Rulon Eisenmenger, MD, MPH 06/28/2020  I, Cloyde Reams Dorshimer, am acting as scribe for Dr. Nicholas Lose.  I have reviewed the above documentation for accuracy and completeness, and I agree with the above.

## 2020-06-28 ENCOUNTER — Inpatient Hospital Stay: Payer: PPO | Attending: Hematology and Oncology | Admitting: Hematology and Oncology

## 2020-06-28 ENCOUNTER — Inpatient Hospital Stay: Payer: PPO

## 2020-06-28 ENCOUNTER — Other Ambulatory Visit: Payer: Self-pay

## 2020-06-28 DIAGNOSIS — D649 Anemia, unspecified: Secondary | ICD-10-CM

## 2020-06-28 LAB — FOLATE: Folate: 6.1 ng/mL (ref >5.9)

## 2020-06-28 LAB — CBC WITH DIFFERENTIAL (CANCER CENTER ONLY)
Abs Immature Granulocytes: 0.02 10*3/uL (ref 0.00–0.07)
Basophils Absolute: 0 10*3/uL (ref 0.0–0.1)
Basophils Relative: 1 %
Eosinophils Absolute: 0.2 10*3/uL (ref 0.0–0.5)
Eosinophils Relative: 3 %
HCT: 29.3 % — ABNORMAL LOW (ref 39.0–52.0)
Hemoglobin: 9.5 g/dL — ABNORMAL LOW (ref 13.0–17.0)
Immature Granulocytes: 0 %
Lymphocytes Relative: 33 %
Lymphs Abs: 2.2 10*3/uL (ref 0.7–4.0)
MCH: 32 pg (ref 26.0–34.0)
MCHC: 32.4 g/dL (ref 30.0–36.0)
MCV: 98.7 fL (ref 80.0–100.0)
Monocytes Absolute: 0.6 10*3/uL (ref 0.1–1.0)
Monocytes Relative: 10 %
Neutro Abs: 3.6 10*3/uL (ref 1.7–7.7)
Neutrophils Relative %: 53 %
Platelet Count: 184 10*3/uL (ref 150–400)
RBC: 2.97 MIL/uL — ABNORMAL LOW (ref 4.22–5.81)
RDW: 14.3 % (ref 11.5–15.5)
WBC Count: 6.6 10*3/uL (ref 4.0–10.5)
nRBC: 0 % (ref 0.0–0.2)

## 2020-06-28 LAB — VITAMIN B12: Vitamin B-12: 251 pg/mL (ref 180–914)

## 2020-06-28 LAB — RETIC PANEL
Immature Retic Fract: 19.5 % — ABNORMAL HIGH (ref 2.3–15.9)
RBC.: 2.99 MIL/uL — ABNORMAL LOW (ref 4.22–5.81)
Retic Count, Absolute: 78 10*3/uL (ref 19.0–186.0)
Retic Ct Pct: 2.6 % (ref 0.4–3.1)
Reticulocyte Hemoglobin: 29.5 pg (ref >27.9)

## 2020-06-28 NOTE — Assessment & Plan Note (Signed)
Lab review: 05/25/2020: Hemoglobin 9, MCV 96.2, RDW 13.9, platelets 242 05/05/2019: Hemoglobin 10.4, platelets 193 12/26/2018: Hemoglobin 10 11/27/2018: Hemoglobin 8.4, MCV 96.6, platelets 232 (patient had 2 surgeries within a month for hernias) 05/07/2017: Hemoglobin 12.5, MCV 99.6, platelets 95 05/08/2016: Hemoglobin 12.9, MCV 97.6, platelets 131 05/24/2015: Hemoglobin 13.6, MCV 98.7, platelets 193  Iron studies are pending. I believe the cause of his anemia is multifactorial. Based on worsening CBC results, I recommended that we did obtain a bone marrow biopsy. Return to clinic in 1 week after bone marrow biopsy for discussion

## 2020-06-29 LAB — FERRITIN: Ferritin: 125 ng/mL (ref 24–336)

## 2020-06-29 LAB — IRON AND TIBC
Iron: 22 ug/dL — ABNORMAL LOW (ref 42–163)
Saturation Ratios: 8 % — ABNORMAL LOW (ref 20–55)
TIBC: 261 ug/dL (ref 202–409)
UIBC: 239 ug/dL (ref 117–376)

## 2020-07-01 DIAGNOSIS — C61 Malignant neoplasm of prostate: Secondary | ICD-10-CM | POA: Diagnosis not present

## 2020-08-27 DIAGNOSIS — R059 Cough, unspecified: Secondary | ICD-10-CM | POA: Diagnosis not present

## 2020-08-27 DIAGNOSIS — J40 Bronchitis, not specified as acute or chronic: Secondary | ICD-10-CM | POA: Diagnosis not present

## 2020-08-30 NOTE — Progress Notes (Signed)
Patient Care Team: Leighton Ruff, MD as PCP - General (Family Medicine)  DIAGNOSIS:    ICD-10-CM   1. Normocytic anemia  D64.9       CHIEF COMPLIANT: Follow-up of normocytic anemia  INTERVAL HISTORY: [name] is a 75 y.o. with above-mentioned history of mild anemia. He presents to the clinic today for follow-up and to review his labs.  He does not report any new or worsening symptoms of anemia but his hemoglobin is slowly declining over the past several months.  He does not feel dizzy or lightheaded.  Denies any shortness of breath to exertion.  ALLERGIES:  has No Known Allergies.  MEDICATIONS:  Current Outpatient Medications  Medication Sig Dispense Refill   aspirin 81 MG chewable tablet Chew 81 mg by mouth daily.      atorvastatin (LIPITOR) 10 MG tablet Take 10 mg by mouth daily.     losartan-hydrochlorothiazide (HYZAAR) 100-25 MG tablet Take 0.5 tablets by mouth daily.      No current facility-administered medications for this visit.    PHYSICAL EXAMINATION: ECOG PERFORMANCE STATUS: 1 - Symptomatic but completely ambulatory  Vitals:   08/31/20 1515  BP: 127/66  Pulse: 76  Resp: 18  Temp: 97.8 F (36.6 C)  SpO2: 100%   Filed Weights   08/31/20 1515  Weight: 212 lb 12.8 oz (96.5 kg)    LABORATORY DATA:  I have reviewed the data as listed CMP Latest Ref Rng & Units 11/19/2018 11/18/2018 11/17/2018  Glucose 70 - 99 mg/dL 121(H) 123(H) 138(H)  BUN 8 - 23 mg/dL 36(H) 45(H) 58(H)  Creatinine 0.61 - 1.24 mg/dL 1.37(H) 1.60(H) 2.40(H)  Sodium 135 - 145 mmol/L 134(L) 136 137  Potassium 3.5 - 5.1 mmol/L 2.9(L) 3.2(L) 3.8  Chloride 98 - 111 mmol/L 101 103 104  CO2 22 - 32 mmol/L 19(L) 26 22  Calcium 8.9 - 10.3 mg/dL 9.0 8.9 9.3  Total Protein 6.5 - 8.1 g/dL 6.4(L) 6.6 7.3  Total Bilirubin 0.3 - 1.2 mg/dL 0.9 0.8 0.7  Alkaline Phos 38 - 126 U/L 76 74 86  AST 15 - 41 U/L 12(L) 13(L) 15  ALT 0 - 44 U/L '11 13 17    ' Lab Results  Component Value Date   WBC  7.3 08/31/2020   HGB 8.6 (L) 08/31/2020   HCT 27.4 (L) 08/31/2020   MCV 97.5 08/31/2020   PLT 262 08/31/2020   NEUTROABS 3.7 08/31/2020    ASSESSMENT & PLAN:  Normocytic anemia Lab review: 08/31/2020: Hemoglobin 8.6, MCV 97.5, WBC 7.3, platelets 262 06/28/2020: Hemoglobin 9.5, MCV 98.7, WBC 6.6, platelets 184 05/25/2020: Hemoglobin 9, MCV 96.2, RDW 13.9, platelets 242 05/05/2019: Hemoglobin 10.4, platelets 193 12/26/2018: Hemoglobin 10 11/27/2018: Hemoglobin 8.4, MCV 96.6, platelets 232 (patient had 2 surgeries within a month for hernias)  05/07/2017: Hemoglobin 12.5, MCV 99.6, platelets 95 05/08/2016: Hemoglobin 12.9, MCV 97.6, platelets 131 05/24/2015: Hemoglobin 13.6, MCV 98.7, platelets 193  Differential diagnosis is MDS versus anemia of chronic kidney disease I would like to check serum creatinine with next labs. We would like to do a bone marrow biopsy to rule out MDS.  We will plan to do this on 09/20/2020. He will come back and discuss results on 09/27/2020. If we rule out bone marrow stem cell disorders and we can start him on Retacrit or Aranesp injections.     No orders of the defined types were placed in this encounter.  The patient has a good understanding of the overall plan. he  agrees with it. he will call with any problems that may develop before the next visit here.  Total time spent: 20 mins including face to face time and time spent for planning, charting and coordination of care  Rulon Eisenmenger, MD, MPH 08/31/2020  I, Thana Ates, am acting as scribe for Dr. Nicholas Lose.  I have reviewed the above documentation for accuracy and completeness, and I agree with the above.

## 2020-08-31 ENCOUNTER — Inpatient Hospital Stay: Payer: PPO | Attending: Hematology and Oncology

## 2020-08-31 ENCOUNTER — Telehealth: Payer: Self-pay | Admitting: *Deleted

## 2020-08-31 ENCOUNTER — Inpatient Hospital Stay: Payer: PPO | Admitting: Hematology and Oncology

## 2020-08-31 ENCOUNTER — Other Ambulatory Visit: Payer: Self-pay

## 2020-08-31 DIAGNOSIS — D649 Anemia, unspecified: Secondary | ICD-10-CM | POA: Insufficient documentation

## 2020-08-31 LAB — CBC WITH DIFFERENTIAL (CANCER CENTER ONLY)
Abs Immature Granulocytes: 0.02 10*3/uL (ref 0.00–0.07)
Basophils Absolute: 0 10*3/uL (ref 0.0–0.1)
Basophils Relative: 0 %
Eosinophils Absolute: 0.2 10*3/uL (ref 0.0–0.5)
Eosinophils Relative: 2 %
HCT: 27.4 % — ABNORMAL LOW (ref 39.0–52.0)
Hemoglobin: 8.6 g/dL — ABNORMAL LOW (ref 13.0–17.0)
Immature Granulocytes: 0 %
Lymphocytes Relative: 40 %
Lymphs Abs: 2.9 10*3/uL (ref 0.7–4.0)
MCH: 30.6 pg (ref 26.0–34.0)
MCHC: 31.4 g/dL (ref 30.0–36.0)
MCV: 97.5 fL (ref 80.0–100.0)
Monocytes Absolute: 0.5 10*3/uL (ref 0.1–1.0)
Monocytes Relative: 6 %
Neutro Abs: 3.7 10*3/uL (ref 1.7–7.7)
Neutrophils Relative %: 52 %
Platelet Count: 262 10*3/uL (ref 150–400)
RBC: 2.81 MIL/uL — ABNORMAL LOW (ref 4.22–5.81)
RDW: 14.1 % (ref 11.5–15.5)
WBC Count: 7.3 10*3/uL (ref 4.0–10.5)
nRBC: 0 % (ref 0.0–0.2)

## 2020-08-31 NOTE — Assessment & Plan Note (Signed)
Lab review: 08/31/2020: 06/28/2020: Hemoglobin 9.5, MCV 98.7, WBC 6.6, platelets 184 05/25/2020: Hemoglobin 9, MCV 96.2, RDW 13.9, platelets 242 05/05/2019:Hemoglobin 10.4, platelets 193 12/26/2018: Hemoglobin 10 11/27/2018: Hemoglobin 8.4, MCV 96.6, platelets 232 (patient had 2 surgeries within a month for hernias) 05/07/2017: Hemoglobin 12.5, MCV 99.6, platelets 95 05/08/2016: Hemoglobin 12.9, MCV 97.6, platelets 131 05/24/2015: Hemoglobin 13.6, MCV 98.7, platelets 193   Return to clinic in 2 months with labs and follow-up.

## 2020-08-31 NOTE — Telephone Encounter (Signed)
This RN scheduled pt for BMBX on 09/20/2020 per Dr Lindi Adie  Bed allocated with charge nurse and path dept notified - LOS sent per above with lab post marrow.

## 2020-08-31 NOTE — Telephone Encounter (Signed)
See prior entry.

## 2020-09-20 ENCOUNTER — Inpatient Hospital Stay: Payer: PPO | Attending: Hematology and Oncology

## 2020-09-20 ENCOUNTER — Other Ambulatory Visit: Payer: Self-pay

## 2020-09-20 ENCOUNTER — Other Ambulatory Visit: Payer: PPO

## 2020-09-20 ENCOUNTER — Inpatient Hospital Stay (HOSPITAL_BASED_OUTPATIENT_CLINIC_OR_DEPARTMENT_OTHER): Payer: PPO | Admitting: Hematology and Oncology

## 2020-09-20 ENCOUNTER — Other Ambulatory Visit: Payer: Self-pay | Admitting: Hematology and Oncology

## 2020-09-20 VITALS — BP 152/82 | HR 60 | Temp 97.8°F | Resp 17 | Ht 70.0 in | Wt 211.8 lb

## 2020-09-20 DIAGNOSIS — D7589 Other specified diseases of blood and blood-forming organs: Secondary | ICD-10-CM | POA: Diagnosis not present

## 2020-09-20 DIAGNOSIS — D649 Anemia, unspecified: Secondary | ICD-10-CM | POA: Diagnosis not present

## 2020-09-20 LAB — CBC WITH DIFFERENTIAL (CANCER CENTER ONLY)
Abs Immature Granulocytes: 0.01 10*3/uL (ref 0.00–0.07)
Basophils Absolute: 0 10*3/uL (ref 0.0–0.1)
Basophils Relative: 0 %
Eosinophils Absolute: 0.2 10*3/uL (ref 0.0–0.5)
Eosinophils Relative: 3 %
HCT: 30.1 % — ABNORMAL LOW (ref 39.0–52.0)
Hemoglobin: 9.6 g/dL — ABNORMAL LOW (ref 13.0–17.0)
Immature Granulocytes: 0 %
Lymphocytes Relative: 41 %
Lymphs Abs: 2.5 10*3/uL (ref 0.7–4.0)
MCH: 31.3 pg (ref 26.0–34.0)
MCHC: 31.9 g/dL (ref 30.0–36.0)
MCV: 98 fL (ref 80.0–100.0)
Monocytes Absolute: 0.4 10*3/uL (ref 0.1–1.0)
Monocytes Relative: 6 %
Neutro Abs: 3 10*3/uL (ref 1.7–7.7)
Neutrophils Relative %: 50 %
Platelet Count: 220 10*3/uL (ref 150–400)
RBC: 3.07 MIL/uL — ABNORMAL LOW (ref 4.22–5.81)
RDW: 14.9 % (ref 11.5–15.5)
WBC Count: 5.9 10*3/uL (ref 4.0–10.5)
nRBC: 0 % (ref 0.0–0.2)

## 2020-09-20 LAB — CMP (CANCER CENTER ONLY)
ALT: 32 U/L (ref 0–44)
AST: 20 U/L (ref 15–41)
Albumin: 3.9 g/dL (ref 3.5–5.0)
Alkaline Phosphatase: 84 U/L (ref 38–126)
Anion gap: 9 (ref 5–15)
BUN: 24 mg/dL — ABNORMAL HIGH (ref 8–23)
CO2: 23 mmol/L (ref 22–32)
Calcium: 10.2 mg/dL (ref 8.9–10.3)
Chloride: 108 mmol/L (ref 98–111)
Creatinine: 1.61 mg/dL — ABNORMAL HIGH (ref 0.61–1.24)
GFR, Estimated: 44 mL/min — ABNORMAL LOW (ref >60)
Glucose, Bld: 94 mg/dL (ref 70–99)
Potassium: 4.6 mmol/L (ref 3.5–5.1)
Sodium: 140 mmol/L (ref 135–145)
Total Bilirubin: 0.5 mg/dL (ref 0.3–1.2)
Total Protein: 8.1 g/dL (ref 6.5–8.1)

## 2020-09-20 MED ORDER — LIDOCAINE HCL 2 % IJ SOLN
INTRAMUSCULAR | Status: AC
  Filled 2020-09-20: qty 40

## 2020-09-20 NOTE — Progress Notes (Signed)
INDICATION: Worsening normocytic anemia  Bone Marrow Biopsy and Aspiration Procedure Note   Informed consent was obtained and potential risks including bleeding, infection and pain were reviewed with the patient.  The patient's name, date of birth, identification, consent and allergies were verified prior to the start of procedure and time out was performed.  The left posterior iliac crest was chosen as the site of biopsy.  The skin was prepped with ChloraPrep.   8 cc of 1% lidocaine was used to provide local anaesthesia.   10 cc of bone marrow aspirate was obtained followed by 1cm biopsy.  Pressure was applied to the biopsy site and bandage was placed over the biopsy site. Patient was made to lie on the back for 15 mins prior to discharge.  The procedure was tolerated well. COMPLICATIONS: None BLOOD LOSS: none The patient was discharged home in stable condition with a 1 week follow up to review results.  Patient was provided with post bone marrow biopsy instructions and instructed to call if there was any bleeding or worsening pain.  Specimens sent for flow cytometry, cytogenetics and additional studies.  Signed Harriette Ohara, MD

## 2020-09-20 NOTE — Progress Notes (Signed)
Bone marrow biopsy performed by Dr. Lindi Adie.  Biopsy site dry and intact.  Pt remained WNLs.

## 2020-09-20 NOTE — Patient Instructions (Signed)
Bone Marrow Aspiration and Bone Marrow Biopsy, Adult, Care After This sheet gives you information about how to care for yourself after your procedure. Your health care provider may also give you more specific instructions. If you have problems or questions, contact your health careprovider. What can I expect after the procedure? After the procedure, it is common to have: Mild pain and tenderness. Swelling. Bruising. Follow these instructions at home: Puncture site care  Follow instructions from your health care provider about how to take care of the puncture site. Make sure you: Wash your hands with soap and water before and after you change your bandage (dressing). If soap and water are not available, use hand sanitizer. Change your dressing as told by your health care provider. Check your puncture site every day for signs of infection. Check for: More redness, swelling, or pain. Fluid or blood. Warmth. Pus or a bad smell.  Activity Return to your normal activities as told by your health care provider. Ask your health care provider what activities are safe for you. Do not lift anything that is heavier than 10 lb (4.5 kg), or the limit that you are told, until your health care provider says that it is safe. Do not drive for 24 hours if you were given a sedative during your procedure. General instructions  Take over-the-counter and prescription medicines only as told by your health care provider. Do not take baths, swim, or use a hot tub until your health care provider approves. Ask your health care provider if you may take showers. You may only be allowed to take sponge baths. If directed, put ice on the affected area. To do this: Put ice in a plastic bag. Place a towel between your skin and the bag. Leave the ice on for 20 minutes, 2-3 times a day. Keep all follow-up visits as told by your health care provider. This is important.  Contact a health care provider if: Your pain is not  controlled with medicine. You have a fever. You have more redness, swelling, or pain around the puncture site. You have fluid or blood coming from the puncture site. Your puncture site feels warm to the touch. You have pus or a bad smell coming from the puncture site. Summary After the procedure, it is common to have mild pain, tenderness, swelling, and bruising. Follow instructions from your health care provider about how to take care of the puncture site and what activities are safe for you. Take over-the-counter and prescription medicines only as told by your health care provider. Contact a health care provider if you have any signs of infection, such as fluid or blood coming from the puncture site. This information is not intended to replace advice given to you by your health care provider. Make sure you discuss any questions you have with your healthcare provider. Document Revised: 06/18/2018 Document Reviewed: 06/18/2018 Elsevier Patient Education  2022 [employer]  

## 2020-09-21 LAB — KAPPA/LAMBDA LIGHT CHAINS
Kappa free light chain: 66 mg/L — ABNORMAL HIGH (ref 3.3–19.4)
Kappa, lambda light chain ratio: 2.69 — ABNORMAL HIGH (ref 0.26–1.65)
Lambda free light chains: 24.5 mg/L (ref 5.7–26.3)

## 2020-09-23 LAB — MULTIPLE MYELOMA PANEL, SERUM
Albumin SerPl Elph-Mcnc: 3.7 g/dL (ref 2.9–4.4)
Albumin/Glob SerPl: 1.1 (ref 0.7–1.7)
Alpha 1: 0.3 g/dL (ref 0.0–0.4)
Alpha2 Glob SerPl Elph-Mcnc: 0.8 g/dL (ref 0.4–1.0)
B-Globulin SerPl Elph-Mcnc: 1 g/dL (ref 0.7–1.3)
Gamma Glob SerPl Elph-Mcnc: 1.6 g/dL (ref 0.4–1.8)
Globulin, Total: 3.7 g/dL (ref 2.2–3.9)
IgA: 266 mg/dL (ref 61–437)
IgG (Immunoglobin G), Serum: 1553 mg/dL (ref 603–1613)
IgM (Immunoglobulin M), Srm: 113 mg/dL (ref 15–143)
Total Protein ELP: 7.4 g/dL (ref 6.0–8.5)

## 2020-09-26 NOTE — Progress Notes (Signed)
Patient Care Team: Leighton Ruff, MD as PCP - General (Family Medicine)  DIAGNOSIS:    ICD-10-CM   1. Normocytic anemia  D64.9 CBC with Differential ([place])    CMP ([place] only)      CHIEF COMPLIANT:  Follow-up of normocytic anemia  INTERVAL HISTORY: [name] is a 75 y.o. with above-mentioned history of mild anemia. He presents to the clinic today for follow-up and to review his labs.  He has recovered very well from a bone marrow biopsy procedure.  He does not report any new problems or concerns.  ALLERGIES:  has No Known Allergies.  MEDICATIONS:  Current Outpatient Medications  Medication Sig Dispense Refill   aspirin 81 MG chewable tablet Chew 81 mg by mouth daily.      atorvastatin (LIPITOR) 10 MG tablet Take 10 mg by mouth daily.     losartan-hydrochlorothiazide (HYZAAR) 100-25 MG tablet Take 0.5 tablets by mouth daily.      No current facility-administered medications for this visit.    PHYSICAL EXAMINATION: ECOG PERFORMANCE STATUS: 1 - Symptomatic but completely ambulatory  Vitals:   09/27/20 1447  BP: (!) 153/63  Pulse: 76  Resp: 18  Temp: 97.8 F (36.6 C)  SpO2: 100%   Filed Weights   09/27/20 1447  Weight: 212 lb 8 oz (96.4 kg)    LABORATORY DATA:  I have reviewed the data as listed CMP Latest Ref Rng & Units 09/20/2020 11/19/2018 11/18/2018  Glucose 70 - 99 mg/dL 94 121(H) 123(H)  BUN 8 - 23 mg/dL 24(H) 36(H) 45(H)  Creatinine 0.61 - 1.24 mg/dL 1.61(H) 1.37(H) 1.60(H)  Sodium 135 - 145 mmol/L 140 134(L) 136  Potassium 3.5 - 5.1 mmol/L 4.6 2.9(L) 3.2(L)  Chloride 98 - 111 mmol/L 108 101 103  CO2 22 - 32 mmol/L 23 19(L) 26  Calcium 8.9 - 10.3 mg/dL 10.2 9.0 8.9  Total Protein 6.5 - 8.1 g/dL 8.1 6.4(L) 6.6  Total Bilirubin 0.3 - 1.2 mg/dL 0.5 0.9 0.8  Alkaline Phos 38 - 126 U/L 84 76 74  AST 15 - 41 U/L 20 12(L) 13(L)  ALT 0 - 44 U/L 32 11 13    Lab Results  Component Value Date   WBC 5.9 09/20/2020   HGB 9.6 (L)  09/20/2020   HCT 30.1 (L) 09/20/2020   MCV 98.0 09/20/2020   PLT 220 09/20/2020   NEUTROABS 3.0 09/20/2020    ASSESSMENT & PLAN:  Normocytic anemia Lab review: 09/20/2020: Hemoglobin 9.6, MCV 98, creatinine 1.61, SPEP: No M protein 08/31/2020: Hemoglobin 8.6, MCV 97.5, WBC 7.3, platelets 262 06/28/2020: Hemoglobin 9.5, MCV 98.7, WBC 6.6, platelets 184 05/25/2020: Hemoglobin 9, MCV 96.2, RDW 13.9, platelets 242 05/05/2019: Hemoglobin 10.4, platelets 193 12/26/2018: Hemoglobin 10 11/27/2018: Hemoglobin 8.4, MCV 96.6, platelets 232 (patient had 2 surgeries within a month for hernias)  05/07/2017: Hemoglobin 12.5, MCV 99.6, platelets 95 05/08/2016: Hemoglobin 12.9, MCV 97.6, platelets 131 05/24/2015: Hemoglobin 13.6, MCV 98.7, platelets 193   Differential diagnosis is MDS versus anemia of chronic kidney disease  Bone marrow biopsy 09/20/2020: Normocellular marrow with mild erythroid hyperplasia, no dysplasia, FISH and cytogenetics are pending.  Based on disciplinary results we can rule out myelodysplasia.  We can attribute anemia to anemia of chronic disease.  Plan: Somehow the hemoglobin has suddenly improved to 9.6.  Therefore we can watch and monitor.  Erythropoietin stimulating agent would be beneficial and will be considered if the hemoglobin stays below 10 and a consistent basis.  Return  to clinic in 3 months with labs and follow-up    Orders Placed This Encounter  Procedures   CBC with Differential ([facility] Only)    Standing Status:   Future    Standing Expiration Date:   09/27/2021   CMP ([facility] only)    Standing Status:   Future    Standing Expiration Date:   09/27/2021   The patient has a good understanding of the overall plan. he agrees with it. he will call with any problems that may develop before the next visit here.  Total time spent: 20 mins including face to face time and time spent for planning, charting and coordination of care  Rulon Eisenmenger, MD,  MPH 09/27/2020  I, Thana Ates, am acting as scribe for Dr. Nicholas Lose.  I have reviewed the above documentation for accuracy and completeness, and I agree with the above.

## 2020-09-27 ENCOUNTER — Encounter (HOSPITAL_COMMUNITY): Payer: Self-pay | Admitting: Hematology and Oncology

## 2020-09-27 ENCOUNTER — Inpatient Hospital Stay: Payer: PPO | Admitting: Hematology and Oncology

## 2020-09-27 ENCOUNTER — Other Ambulatory Visit: Payer: Self-pay

## 2020-09-27 DIAGNOSIS — D649 Anemia, unspecified: Secondary | ICD-10-CM | POA: Diagnosis not present

## 2020-09-27 NOTE — Assessment & Plan Note (Signed)
Lab review: 09/20/2020: Hemoglobin 9.6, MCV 98, creatinine 1.61, SPEP: No M protein 08/31/2020: Hemoglobin 8.6, MCV 97.5, WBC 7.3, platelets 262 06/28/2020: Hemoglobin 9.5, MCV 98.7, WBC 6.6, platelets 184 05/25/2020: Hemoglobin 9, MCV 96.2, RDW 13.9, platelets 242 05/05/2019:Hemoglobin 10.4, platelets 193 12/26/2018: Hemoglobin 10 11/27/2018: Hemoglobin 8.4, MCV 96.6, platelets 232 (patient had 2 surgeries within a month for hernias) 05/07/2017: Hemoglobin 12.5, MCV 99.6, platelets 95 05/08/2016: Hemoglobin 12.9, MCV 97.6, platelets 131 05/24/2015: Hemoglobin 13.6, MCV 98.7, platelets 193  Differential diagnosis is MDS versus anemia of chronic kidney disease  Bone marrow biopsy 09/20/2020: Normocellular marrow with mild erythroid hyperplasia, no dysplasia, FISH and cytogenetics are pending.  Based on disciplinary results we can rule out myelodysplasia.  We can attribute anemia to anemia of chronic disease.  Plan: Somehow the hemoglobin has suddenly improved to 9.6.  Therefore we can watch and monitor.  Erythropoietin stimulating agent would be beneficial and will be considered if the hemoglobin stays below 10 and a consistent basis.  Return to clinic in 3 months with labs and follow-up

## 2020-09-28 ENCOUNTER — Encounter (HOSPITAL_COMMUNITY): Payer: Self-pay | Admitting: Hematology and Oncology

## 2020-10-01 DIAGNOSIS — M79672 Pain in left foot: Secondary | ICD-10-CM | POA: Diagnosis not present

## 2020-10-01 DIAGNOSIS — M7662 Achilles tendinitis, left leg: Secondary | ICD-10-CM | POA: Diagnosis not present

## 2020-10-07 LAB — SURGICAL PATHOLOGY

## 2020-10-28 DIAGNOSIS — R9721 Rising PSA following treatment for malignant neoplasm of prostate: Secondary | ICD-10-CM | POA: Diagnosis not present

## 2020-11-04 DIAGNOSIS — C61 Malignant neoplasm of prostate: Secondary | ICD-10-CM | POA: Diagnosis not present

## 2020-12-30 ENCOUNTER — Ambulatory Visit: Payer: PPO | Admitting: Hematology and Oncology

## 2021-01-03 NOTE — Progress Notes (Signed)
Patient Care Team: Leighton Ruff, MD (Inactive) as PCP - General (Family Medicine)  DIAGNOSIS:    ICD-10-CM   1. Normocytic anemia  D64.9       CHIEF COMPLIANT: Follow-up of normocytic anemia  INTERVAL HISTORY: [name] is a 75 y.o. with above-mentioned history of mild anemia. He presents to the clinic today for follow-up.  He reports no new problems or concerns.  He feels generally well without any fatigue or shortness of breath or lightheadedness or dizziness.  ALLERGIES:  has No Known Allergies.  MEDICATIONS:  Current Outpatient Medications  Medication Sig Dispense Refill   aspirin 81 MG chewable tablet Chew 81 mg by mouth daily.      atorvastatin (LIPITOR) 10 MG tablet Take 10 mg by mouth daily.     losartan-hydrochlorothiazide (HYZAAR) 100-25 MG tablet Take 0.5 tablets by mouth daily.      No current facility-administered medications for this visit.    PHYSICAL EXAMINATION: ECOG PERFORMANCE STATUS: 1 - Symptomatic but completely ambulatory  There were no vitals filed for this visit. There were no vitals filed for this visit.  LABORATORY DATA:  I have reviewed the data as listed CMP Latest Ref Rng & Units 09/20/2020 11/19/2018 11/18/2018  Glucose 70 - 99 mg/dL 94 121(H) 123(H)  BUN 8 - 23 mg/dL 24(H) 36(H) 45(H)  Creatinine 0.61 - 1.24 mg/dL 1.61(H) 1.37(H) 1.60(H)  Sodium 135 - 145 mmol/L 140 134(L) 136  Potassium 3.5 - 5.1 mmol/L 4.6 2.9(L) 3.2(L)  Chloride 98 - 111 mmol/L 108 101 103  CO2 22 - 32 mmol/L 23 19(L) 26  Calcium 8.9 - 10.3 mg/dL 10.2 9.0 8.9  Total Protein 6.5 - 8.1 g/dL 8.1 6.4(L) 6.6  Total Bilirubin 0.3 - 1.2 mg/dL 0.5 0.9 0.8  Alkaline Phos 38 - 126 U/L 84 76 74  AST 15 - 41 U/L 20 12(L) 13(L)  ALT 0 - 44 U/L 32 11 13    Lab Results  Component Value Date   WBC 5.9 09/20/2020   HGB 9.6 (L) 09/20/2020   HCT 30.1 (L) 09/20/2020   MCV 98.0 09/20/2020   PLT 220 09/20/2020   NEUTROABS 3.0 09/20/2020    ASSESSMENT & PLAN:   Normocytic anemia 01/04/2021: Hemoglobin 9.6 09/20/2020: Hemoglobin 9.6, MCV 98, creatinine 1.61, SPEP: No M protein 08/31/2020: Hemoglobin 8.6, MCV 97.5, WBC 7.3, platelets 262 06/28/2020: Hemoglobin 9.5, MCV 98.7, WBC 6.6, platelets 184 05/25/2020: Hemoglobin 9, MCV 96.2, RDW 13.9, platelets 242 05/05/2019: Hemoglobin 10.4, platelets 193 12/26/2018: Hemoglobin 10 11/27/2018: Hemoglobin 8.4, MCV 96.6, platelets 232 (patient had 2 surgeries within a month for hernias)  05/07/2017: Hemoglobin 12.5, MCV 99.6, platelets 95 05/08/2016: Hemoglobin 12.9, MCV 97.6, platelets 131 05/24/2015: Hemoglobin 13.6, MCV 98.7, platelets 193    Differential diagnosis is MDS versus anemia of chronic kidney disease   Bone marrow biopsy 09/20/2020: Normocellular marrow with mild erythroid hyperplasia, no dysplasia, FISH and cytogenetics are pending. We can attribute anemia to anemia of chronic disease. Overall his hemoglobin is remained stable and he is asymptomatic and therefore we decided to watch and monitor  Return to clinic in 6 months for labs and follow-up    No orders of the defined types were placed in this encounter.  The patient has a good understanding of the overall plan. he agrees with it. he will call with any problems that may develop before the next visit here.  Total time spent: 20 mins including face to face time and time spent for planning, charting  and coordination of care  Rulon Eisenmenger, MD, MPH 01/04/2021  I, Thana Ates, am acting as scribe for Dr. Nicholas Lose.  I have reviewed the above documentation for accuracy and completeness, and I agree with the above.

## 2021-01-04 ENCOUNTER — Other Ambulatory Visit: Payer: Self-pay

## 2021-01-04 ENCOUNTER — Inpatient Hospital Stay: Payer: PPO | Attending: Hematology and Oncology | Admitting: Hematology and Oncology

## 2021-01-04 ENCOUNTER — Inpatient Hospital Stay: Payer: PPO

## 2021-01-04 DIAGNOSIS — D649 Anemia, unspecified: Secondary | ICD-10-CM | POA: Diagnosis not present

## 2021-01-04 LAB — CBC WITH DIFFERENTIAL (CANCER CENTER ONLY)
Abs Immature Granulocytes: 0.01 10*3/uL (ref 0.00–0.07)
Basophils Absolute: 0 10*3/uL (ref 0.0–0.1)
Basophils Relative: 0 %
Eosinophils Absolute: 0.2 10*3/uL (ref 0.0–0.5)
Eosinophils Relative: 3 %
HCT: 29.5 % — ABNORMAL LOW (ref 39.0–52.0)
Hemoglobin: 9.4 g/dL — ABNORMAL LOW (ref 13.0–17.0)
Immature Granulocytes: 0 %
Lymphocytes Relative: 41 %
Lymphs Abs: 2.1 10*3/uL (ref 0.7–4.0)
MCH: 30.8 pg (ref 26.0–34.0)
MCHC: 31.9 g/dL (ref 30.0–36.0)
MCV: 96.7 fL (ref 80.0–100.0)
Monocytes Absolute: 0.7 10*3/uL (ref 0.1–1.0)
Monocytes Relative: 14 %
Neutro Abs: 2.1 10*3/uL (ref 1.7–7.7)
Neutrophils Relative %: 42 %
Platelet Count: 176 10*3/uL (ref 150–400)
RBC: 3.05 MIL/uL — ABNORMAL LOW (ref 4.22–5.81)
RDW: 13.2 % (ref 11.5–15.5)
WBC Count: 5.1 10*3/uL (ref 4.0–10.5)
nRBC: 0 % (ref 0.0–0.2)

## 2021-01-04 LAB — CMP (CANCER CENTER ONLY)
ALT: 17 U/L (ref 0–44)
AST: 20 U/L (ref 15–41)
Albumin: 3.7 g/dL (ref 3.5–5.0)
Alkaline Phosphatase: 85 U/L (ref 38–126)
Anion gap: 11 (ref 5–15)
BUN: 23 mg/dL (ref 8–23)
CO2: 24 mmol/L (ref 22–32)
Calcium: 9.7 mg/dL (ref 8.9–10.3)
Chloride: 106 mmol/L (ref 98–111)
Creatinine: 1.76 mg/dL — ABNORMAL HIGH (ref 0.61–1.24)
GFR, Estimated: 40 mL/min — ABNORMAL LOW (ref >60)
Glucose, Bld: 116 mg/dL — ABNORMAL HIGH (ref 70–99)
Potassium: 4 mmol/L (ref 3.5–5.1)
Sodium: 141 mmol/L (ref 135–145)
Total Bilirubin: 0.4 mg/dL (ref 0.3–1.2)
Total Protein: 7.7 g/dL (ref 6.5–8.1)

## 2021-01-04 NOTE — Assessment & Plan Note (Signed)
09/20/2020: Hemoglobin 9.6, MCV 98, creatinine 1.61, SPEP: No M protein 08/31/2020:Hemoglobin 8.6, MCV 97.5, WBC 7.3, platelets 262 06/28/2020: Hemoglobin 9.5, MCV 98.7, WBC 6.6, platelets 184 05/25/2020: Hemoglobin 9, MCV 96.2, RDW 13.9, platelets 242 05/05/2019:Hemoglobin 10.4, platelets 193 12/26/2018: Hemoglobin 10 11/27/2018: Hemoglobin 8.4, MCV 96.6, platelets 232 (patient had 2 surgeries within a month for hernias) 05/07/2017: Hemoglobin 12.5, MCV 99.6, platelets 95 05/08/2016: Hemoglobin 12.9, MCV 97.6, platelets 131 05/24/2015: Hemoglobin 13.6, MCV 98.7, platelets 193 01/04/2021:  Differential diagnosis is MDS versus anemia of chronic kidney disease  Bone marrow biopsy 09/20/2020: Normocellular marrow with mild erythroid hyperplasia, no dysplasia, FISH and cytogenetics are pending.  Based on disciplinary results we can rule out myelodysplasia.  We can attribute anemia to anemia of chronic disease.  Return to clinic every 3 months for labs and follow-up

## 2021-02-14 DIAGNOSIS — B349 Viral infection, unspecified: Secondary | ICD-10-CM | POA: Diagnosis not present

## 2021-02-23 DIAGNOSIS — J209 Acute bronchitis, unspecified: Secondary | ICD-10-CM | POA: Diagnosis not present

## 2021-02-28 DIAGNOSIS — C61 Malignant neoplasm of prostate: Secondary | ICD-10-CM | POA: Diagnosis not present

## 2021-03-07 DIAGNOSIS — C61 Malignant neoplasm of prostate: Secondary | ICD-10-CM | POA: Diagnosis not present

## 2021-03-18 ENCOUNTER — Other Ambulatory Visit (HOSPITAL_COMMUNITY): Payer: Self-pay | Admitting: Urology

## 2021-03-18 DIAGNOSIS — C61 Malignant neoplasm of prostate: Secondary | ICD-10-CM

## 2021-04-05 ENCOUNTER — Other Ambulatory Visit: Payer: Self-pay

## 2021-04-05 ENCOUNTER — Encounter (HOSPITAL_COMMUNITY)
Admission: RE | Admit: 2021-04-05 | Discharge: 2021-04-05 | Disposition: A | Discharge status: Home / Self Care | Payer: PPO | Source: Ambulatory Visit | Attending: Urology | Admitting: Urology

## 2021-04-05 DIAGNOSIS — I2510 Atherosclerotic heart disease of native coronary artery without angina pectoris: Secondary | ICD-10-CM | POA: Diagnosis not present

## 2021-04-05 DIAGNOSIS — K769 Liver disease, unspecified: Secondary | ICD-10-CM | POA: Diagnosis not present

## 2021-04-05 DIAGNOSIS — C61 Malignant neoplasm of prostate: Secondary | ICD-10-CM | POA: Insufficient documentation

## 2021-04-05 DIAGNOSIS — N433 Hydrocele, unspecified: Secondary | ICD-10-CM | POA: Diagnosis not present

## 2021-04-05 MED ORDER — PIFLIFOLASTAT F 18 (PYLARIFY) INJECTION
9.0000 | Freq: Once | INTRAVENOUS | Status: AC
  Administered 2021-04-05: 9.5 via INTRAVENOUS

## 2021-05-30 DIAGNOSIS — Z1211 Encounter for screening for malignant neoplasm of colon: Secondary | ICD-10-CM | POA: Diagnosis not present

## 2021-05-30 DIAGNOSIS — E7800 Pure hypercholesterolemia, unspecified: Secondary | ICD-10-CM | POA: Diagnosis not present

## 2021-05-30 DIAGNOSIS — D649 Anemia, unspecified: Secondary | ICD-10-CM | POA: Diagnosis not present

## 2021-05-30 DIAGNOSIS — N1831 Chronic kidney disease, stage 3a: Secondary | ICD-10-CM | POA: Diagnosis not present

## 2021-05-30 DIAGNOSIS — I10 Essential (primary) hypertension: Secondary | ICD-10-CM | POA: Diagnosis not present

## 2021-05-30 DIAGNOSIS — Z0000 Encounter for general adult medical examination without abnormal findings: Secondary | ICD-10-CM | POA: Diagnosis not present

## 2021-05-30 DIAGNOSIS — I6529 Occlusion and stenosis of unspecified carotid artery: Secondary | ICD-10-CM | POA: Diagnosis not present

## 2021-05-30 DIAGNOSIS — C61 Malignant neoplasm of prostate: Secondary | ICD-10-CM | POA: Diagnosis not present

## 2021-05-31 ENCOUNTER — Other Ambulatory Visit: Payer: Self-pay | Admitting: Internal Medicine

## 2021-05-31 DIAGNOSIS — I6529 Occlusion and stenosis of unspecified carotid artery: Secondary | ICD-10-CM

## 2021-06-06 ENCOUNTER — Ambulatory Visit
Admission: RE | Admit: 2021-06-06 | Discharge: 2021-06-06 | Disposition: A | Discharge status: Home / Self Care | Payer: PPO | Source: Ambulatory Visit | Attending: Internal Medicine | Admitting: Internal Medicine

## 2021-06-06 DIAGNOSIS — I6529 Occlusion and stenosis of unspecified carotid artery: Secondary | ICD-10-CM

## 2021-06-06 DIAGNOSIS — I6523 Occlusion and stenosis of bilateral carotid arteries: Secondary | ICD-10-CM | POA: Diagnosis not present

## 2021-06-24 DIAGNOSIS — I10 Essential (primary) hypertension: Secondary | ICD-10-CM | POA: Diagnosis not present

## 2021-06-24 NOTE — Progress Notes (Signed)
Patient Care Team: Leighton Ruff, MD (Inactive) as PCP - General (Family Medicine)  DIAGNOSIS:  Encounter Diagnosis  Name Primary?   Normocytic anemia    CHIEF COMPLIANT: Follow-up of normocytic anemia  INTERVAL HISTORY: [name] is a 76 y.o. with above-mentioned history of mild anemia. He presents to the clinic today for labs and follow-up. States that everything was going good. States that he has lost some weight.  ALLERGIES:  has No Known Allergies.  MEDICATIONS:  Current Outpatient Medications  Medication Sig Dispense Refill   aspirin 81 MG chewable tablet Chew 81 mg by mouth daily.      atorvastatin (LIPITOR) 10 MG tablet Take 10 mg by mouth daily.     losartan-hydrochlorothiazide (HYZAAR) 100-25 MG tablet Take 0.5 tablets by mouth daily.      No current facility-administered medications for this visit.    PHYSICAL EXAMINATION: ECOG PERFORMANCE STATUS: 0 - Asymptomatic  Vitals:   07/04/21 1511  BP: (!) 165/82  Pulse: 73  Resp: 18  Temp: 97.7 F (36.5 C)  SpO2: 100%   Filed Weights   07/04/21 1511  Weight: 198 lb 6.4 oz (90 kg)      LABORATORY DATA:  I have reviewed the data as listed    Latest Ref Rng & Units 07/04/2021    2:47 PM 01/04/2021    2:45 PM 09/20/2020    9:06 AM  CMP  Glucose 70 - 99 mg/dL 88   116   94    BUN 8 - 23 mg/dL '24   23   24    ' Creatinine 0.61 - 1.24 mg/dL 1.67   1.76   1.61    Sodium 135 - 145 mmol/L 138   141   140    Potassium 3.5 - 5.1 mmol/L 4.3   4.0   4.6    Chloride 98 - 111 mmol/L 105   106   108    CO2 22 - 32 mmol/L '26   24   23    ' Calcium 8.9 - 10.3 mg/dL 10.4   9.7   10.2    Total Protein 6.5 - 8.1 g/dL 8.4   7.7   8.1    Total Bilirubin 0.3 - 1.2 mg/dL 0.7   0.4   0.5    Alkaline Phos 38 - 126 U/L 90   85   84    AST 15 - 41 U/L '18   20   20    ' ALT 0 - 44 U/L 20   17   32      Lab Results  Component Value Date   WBC 5.6 07/04/2021   HGB 11.4 (L) 07/04/2021   HCT 35.3 (L) 07/04/2021   MCV 96.4  07/04/2021   PLT 188 07/04/2021   NEUTROABS 3.1 07/04/2021    ASSESSMENT & PLAN:  Normocytic anemia  07/04/2021: Hemoglobin 11.4 01/04/2021: Hemoglobin 9.6 09/20/2020: Hemoglobin 9.6, MCV 98, creatinine 1.61, SPEP: No M protein 08/31/2020: Hemoglobin 8.6, MCV 97.5, WBC 7.3, platelets 262 06/28/2020: Hemoglobin 9.5, MCV 98.7, WBC 6.6, platelets 184 05/25/2020: Hemoglobin 9, MCV 96.2, RDW 13.9, platelets 242 05/05/2019: Hemoglobin 10.4, platelets 193 12/26/2018: Hemoglobin 10 11/27/2018: Hemoglobin 8.4, MCV 96.6, platelets 232 (patient had 2 surgeries within a month for hernias)  05/07/2017: Hemoglobin 12.5, MCV 99.6, platelets 95 05/08/2016: Hemoglobin 12.9, MCV 97.6, platelets 131 05/24/2015: Hemoglobin 13.6, MCV 98.7, platelets 193     Differential diagnosis is MDS versus anemia of chronic kidney disease   Bone  marrow biopsy 09/20/2020: Normocellular marrow with mild erythroid hyperplasia, no dysplasia, FISH and cytogenetics are negative we can attribute anemia to anemia of chronic disease.  His globin is improved significantly and therefore no need of Aranesp injections for his anemia of chronic kidney disease.   Return to clinic in 6 months for labs and follow-up    Orders Placed This Encounter  Procedures   CBC with Differential ([facility] Only)    Standing Status:   Future    Standing Expiration Date:   07/05/2022   CMP ([facility] only)    Standing Status:   Future    Standing Expiration Date:   07/05/2022   Ferritin    Standing Status:   Future    Standing Expiration Date:   07/04/2022   Iron and Iron Binding Capacity ([facility] only)    Standing Status:   Future    Standing Expiration Date:   07/05/2022   The patient has a good understanding of the overall plan. he agrees with it. he will call with any problems that may develop before the next visit here. Total time spent: 30 mins including face to face time and time spent for planning, charting and co-ordination of  care   Harriette Ohara, MD 07/04/21    I Gardiner Coins am scribing for Dr. Lindi Adie  I have reviewed the above documentation for accuracy and completeness, and I agree with the above.

## 2021-06-30 DIAGNOSIS — C61 Malignant neoplasm of prostate: Secondary | ICD-10-CM | POA: Diagnosis not present

## 2021-07-04 ENCOUNTER — Inpatient Hospital Stay: Payer: PPO | Attending: Hematology and Oncology

## 2021-07-04 ENCOUNTER — Inpatient Hospital Stay (HOSPITAL_BASED_OUTPATIENT_CLINIC_OR_DEPARTMENT_OTHER): Payer: PPO | Admitting: Hematology and Oncology

## 2021-07-04 ENCOUNTER — Other Ambulatory Visit: Payer: Self-pay

## 2021-07-04 ENCOUNTER — Telehealth: Payer: Self-pay | Admitting: Hematology and Oncology

## 2021-07-04 DIAGNOSIS — Z79899 Other long term (current) drug therapy: Secondary | ICD-10-CM | POA: Insufficient documentation

## 2021-07-04 DIAGNOSIS — D649 Anemia, unspecified: Secondary | ICD-10-CM | POA: Insufficient documentation

## 2021-07-04 LAB — CBC WITH DIFFERENTIAL (CANCER CENTER ONLY)
Abs Immature Granulocytes: 0.01 10*3/uL (ref 0.00–0.07)
Basophils Absolute: 0 10*3/uL (ref 0.0–0.1)
Basophils Relative: 0 %
Eosinophils Absolute: 0 10*3/uL (ref 0.0–0.5)
Eosinophils Relative: 1 %
HCT: 35.3 % — ABNORMAL LOW (ref 39.0–52.0)
Hemoglobin: 11.4 g/dL — ABNORMAL LOW (ref 13.0–17.0)
Immature Granulocytes: 0 %
Lymphocytes Relative: 38 %
Lymphs Abs: 2.1 10*3/uL (ref 0.7–4.0)
MCH: 31.1 pg (ref 26.0–34.0)
MCHC: 32.3 g/dL (ref 30.0–36.0)
MCV: 96.4 fL (ref 80.0–100.0)
Monocytes Absolute: 0.3 10*3/uL (ref 0.1–1.0)
Monocytes Relative: 5 %
Neutro Abs: 3.1 10*3/uL (ref 1.7–7.7)
Neutrophils Relative %: 56 %
Platelet Count: 188 10*3/uL (ref 150–400)
RBC: 3.66 MIL/uL — ABNORMAL LOW (ref 4.22–5.81)
RDW: 12.9 % (ref 11.5–15.5)
WBC Count: 5.6 10*3/uL (ref 4.0–10.5)
nRBC: 0 % (ref 0.0–0.2)

## 2021-07-04 LAB — CMP (CANCER CENTER ONLY)
ALT: 20 U/L (ref 0–44)
AST: 18 U/L (ref 15–41)
Albumin: 4.5 g/dL (ref 3.5–5.0)
Alkaline Phosphatase: 90 U/L (ref 38–126)
Anion gap: 7 (ref 5–15)
BUN: 24 mg/dL — ABNORMAL HIGH (ref 8–23)
CO2: 26 mmol/L (ref 22–32)
Calcium: 10.4 mg/dL — ABNORMAL HIGH (ref 8.9–10.3)
Chloride: 105 mmol/L (ref 98–111)
Creatinine: 1.67 mg/dL — ABNORMAL HIGH (ref 0.61–1.24)
GFR, Estimated: 42 mL/min — ABNORMAL LOW (ref >60)
Glucose, Bld: 88 mg/dL (ref 70–99)
Potassium: 4.3 mmol/L (ref 3.5–5.1)
Sodium: 138 mmol/L (ref 135–145)
Total Bilirubin: 0.7 mg/dL (ref 0.3–1.2)
Total Protein: 8.4 g/dL — ABNORMAL HIGH (ref 6.5–8.1)

## 2021-07-04 NOTE — Assessment & Plan Note (Signed)
01/04/2021: Hemoglobin 9.6 09/20/2020: Hemoglobin 9.6, MCV 98, creatinine 1.61, SPEP: No M protein 08/31/2020:Hemoglobin 8.6, MCV 97.5, WBC 7.3, platelets 262 06/28/2020: Hemoglobin 9.5, MCV 98.7, WBC 6.6, platelets 184 05/25/2020: Hemoglobin 9, MCV 96.2, RDW 13.9, platelets 242 05/05/2019:Hemoglobin 10.4, platelets 193 12/26/2018: Hemoglobin 10 11/27/2018: Hemoglobin 8.4, MCV 96.6, platelets 232 (patient had 2 surgeries within a month for hernias) 05/07/2017: Hemoglobin 12.5, MCV 99.6, platelets 95 05/08/2016: Hemoglobin 12.9, MCV 97.6, platelets 131 05/24/2015: Hemoglobin 13.6, MCV 98.7, platelets 193   Differential diagnosis is MDS versus anemia of chronic kidney disease  Bone marrow biopsy 09/20/2020: Normocellular marrow with mild erythroid hyperplasia, no dysplasia, FISH and cytogenetics are pending. We can attribute anemia to anemia of chronic disease. Overall his hemoglobin is remained stable and he is asymptomatic and therefore we decided to watch and monitor  Return to clinic in 6 months for labs and follow-up

## 2021-07-04 NOTE — Telephone Encounter (Signed)
Scheduled appointment per 5/22 los. Patient is aware.

## 2021-07-07 DIAGNOSIS — C61 Malignant neoplasm of prostate: Secondary | ICD-10-CM | POA: Diagnosis not present

## 2021-07-13 DIAGNOSIS — I10 Essential (primary) hypertension: Secondary | ICD-10-CM | POA: Diagnosis not present

## 2021-07-13 DIAGNOSIS — N1831 Chronic kidney disease, stage 3a: Secondary | ICD-10-CM | POA: Diagnosis not present

## 2021-07-13 DIAGNOSIS — D649 Anemia, unspecified: Secondary | ICD-10-CM | POA: Diagnosis not present

## 2021-07-13 DIAGNOSIS — E7800 Pure hypercholesterolemia, unspecified: Secondary | ICD-10-CM | POA: Diagnosis not present

## 2021-07-18 ENCOUNTER — Ambulatory Visit: Payer: PPO | Admitting: Gastroenterology

## 2021-07-27 IMAGING — CT CT ABD-PELV W/O CM
2 of 4 series · 15 of 46 positions shown, 17 images · non-contrast
Comparison: 05/30/2017 CT abdomen/pelvis.

CLINICAL DATA: Abdominal distension and pain. Hernia surgery
10/23/2018.

EXAM:
CT ABDOMEN AND PELVIS WITHOUT CONTRAST
TECHNIQUE: Multidetector CT imaging of the abdomen and pelvis was performed
following the standard protocol without IV contrast.

[Series 2: axial st · axial · 0.80mm/px · z∈[+931,+1401]mm · 12 of 108 slices shown, 14 images]
[im 7/108  soft-tissue]
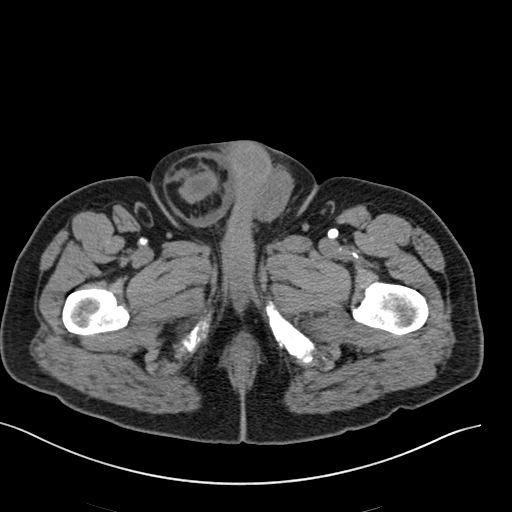
[im 7/108  bone]
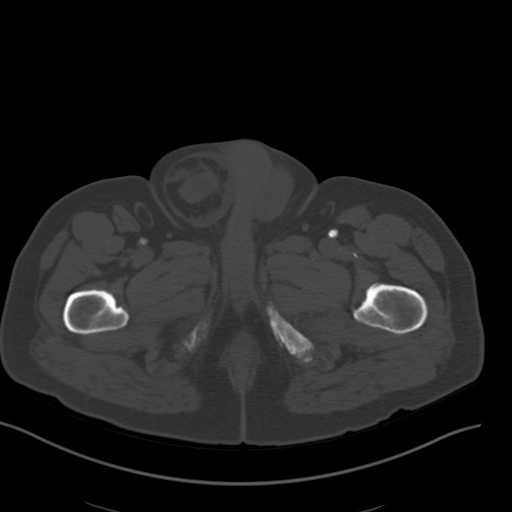
[im 19/108  soft-tissue]
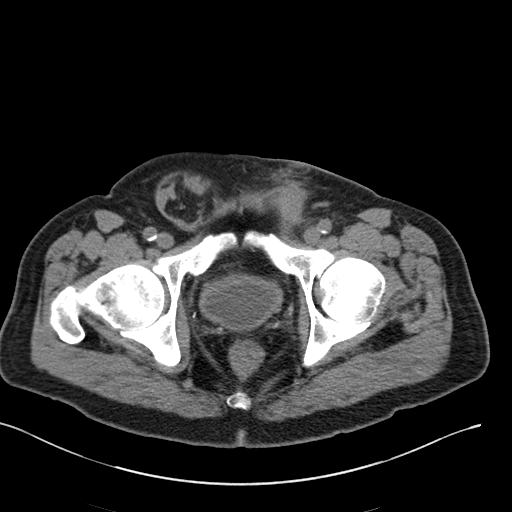
[im 26/108  soft-tissue]
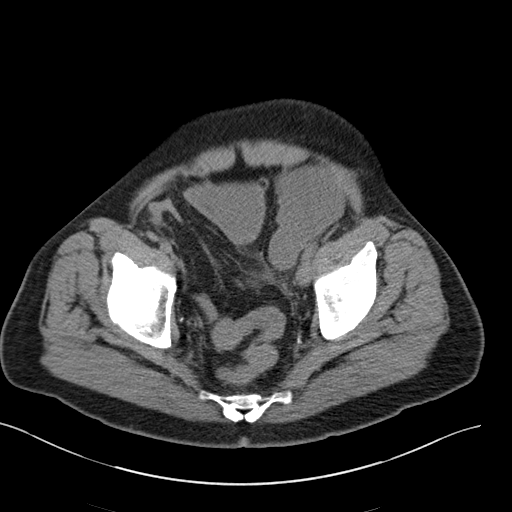
[im 32/108  soft-tissue]
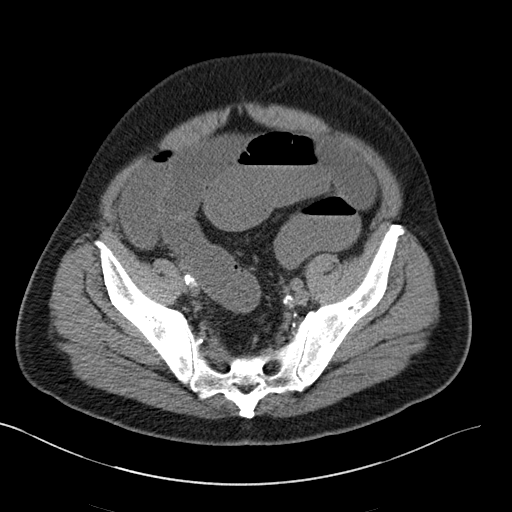
[im 45/108  soft-tissue]
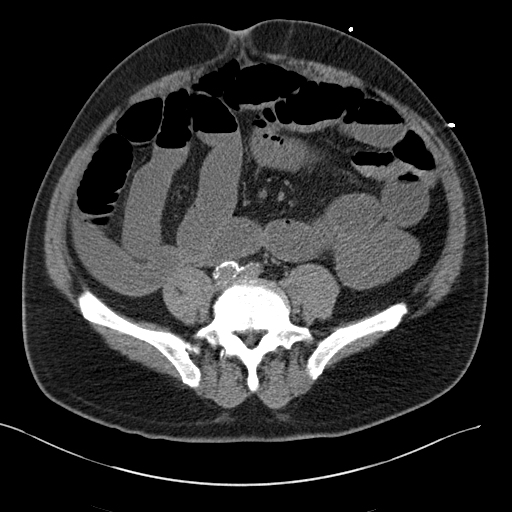
[im 51/108  soft-tissue]
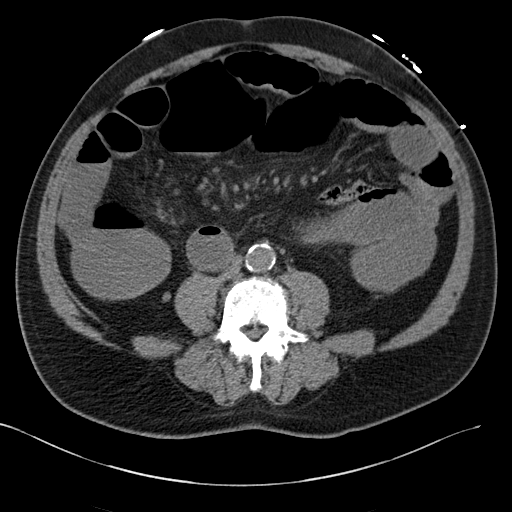
[im 57/108  soft-tissue]
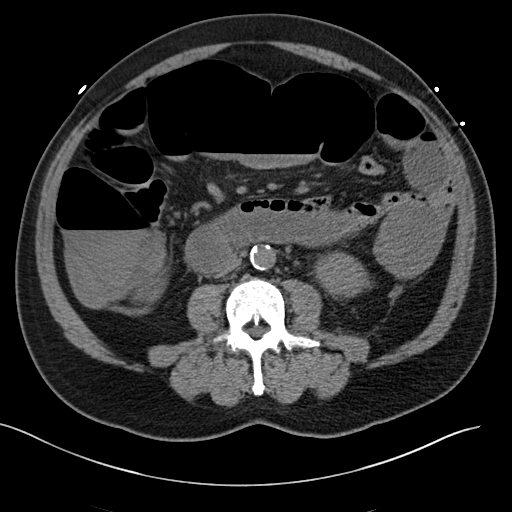
[im 70/108  soft-tissue]
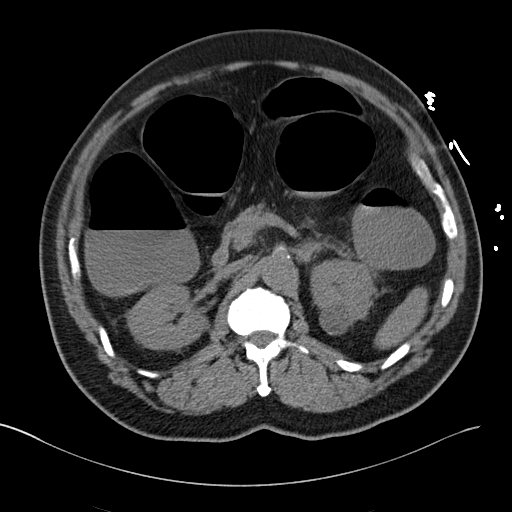
[im 76/108  soft-tissue]
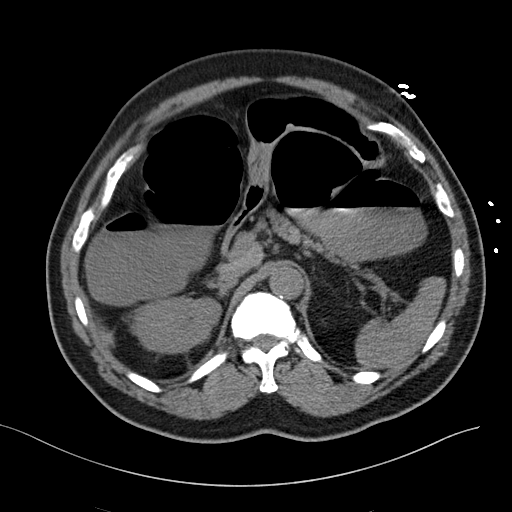
[im 76/108  bone]
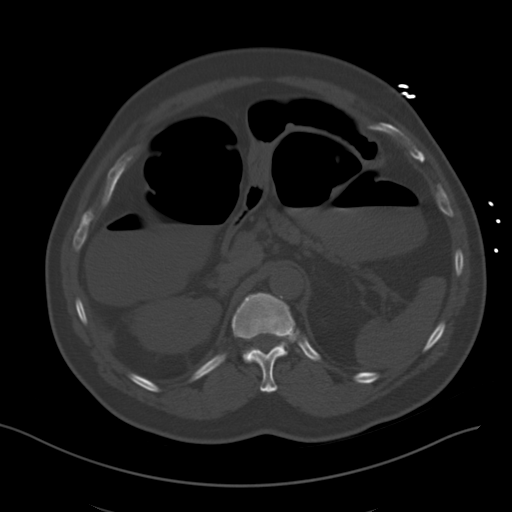
[im 82/108  soft-tissue]
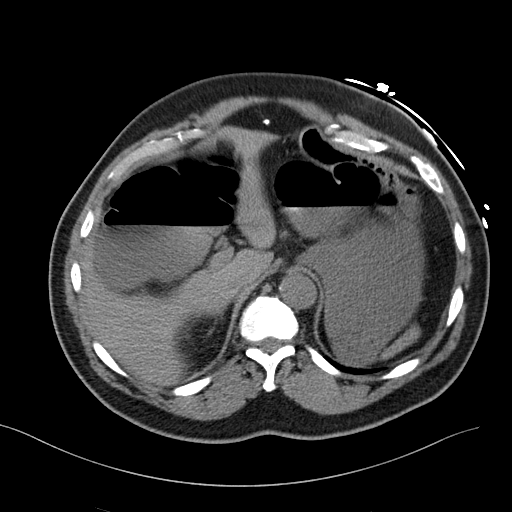
[im 95/108  soft-tissue]
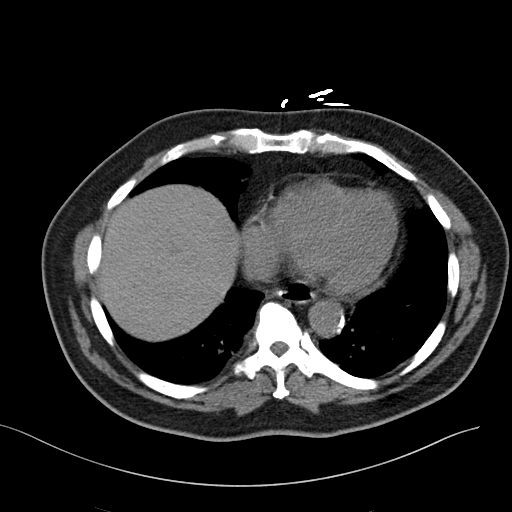
[im 101/108  soft-tissue]
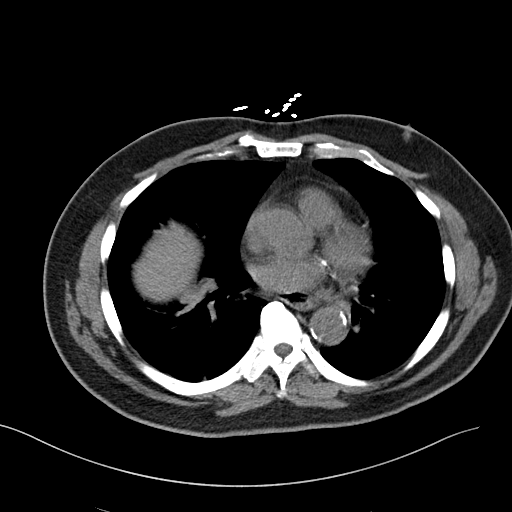

[Series 5: coronal st · coronal · 0.74mm/px · 3 of 160 slices shown]
[im 54/160  soft-tissue]
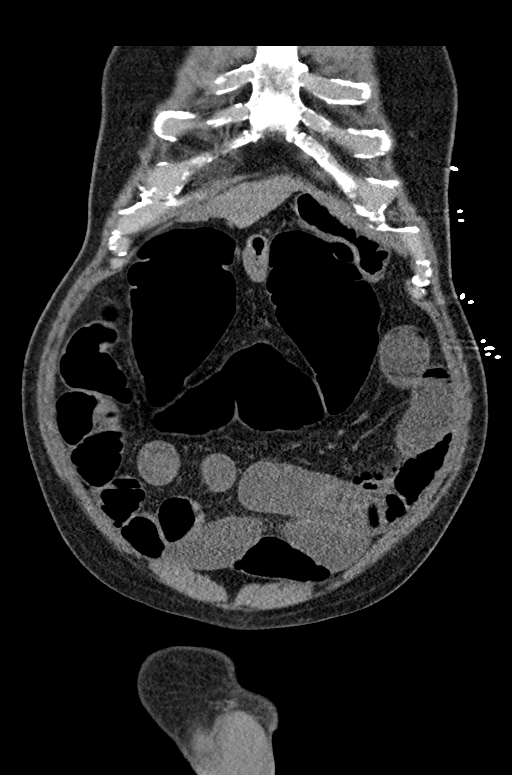
[im 71/160  soft-tissue]
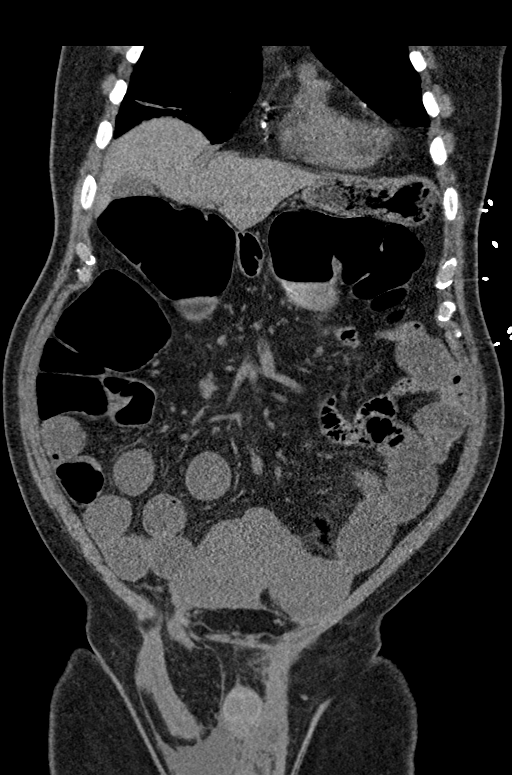
[im 89/160  soft-tissue]
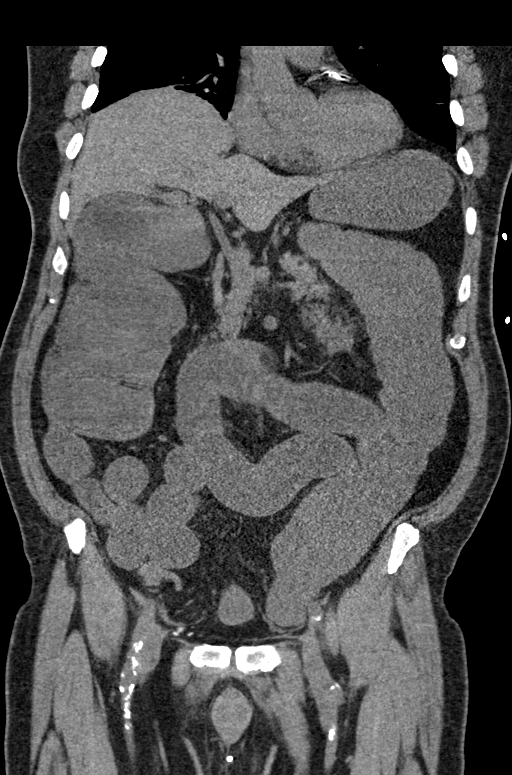

[15 of 46 positions shown; findings below may reference images not displayed]

FINDINGS: Lower chest: Mild platelike atelectasis at both lung bases. Few
scattered small solid pulmonary nodules at both lung bases, largest
4 mm in the right lower lobe (series 4/image 26), not seen on prior
CT. Coronary atherosclerosis

Hepatobiliary: Normal liver size. No liver mass. Normal gallbladder
with no radiopaque cholelithiasis. No biliary ductal dilatation.

Pancreas: Normal, with no mass or duct dilation.

Spleen: Normal size. No mass.

Adrenals/Urinary Tract: No discrete adrenal nodules. No renal
stones. No hydronephrosis. Minimally complex 2.5 cm renal cyst in
the posterior upper left kidney with thin mural calcification
(series 2/image 40), considered Bosniak 2. no additional contour
deforming renal lesions. Normal bladder.

Stomach/Bowel: Normal non-distended stomach. There is high-grade
sigmoid colon obstruction due to moderate right inguinal hernia
containing portion of the sigmoid colon, proximal to which the large
bowel is markedly distended with air-fluid levels. Mid to distal
small bowel is diffusely dilated up to 6.2 cm diameter with
air-fluid levels. No small or large bowel wall thickening or
pneumatosis. Normal appendix.

Vascular/Lymphatic: Atherosclerotic nonaneurysmal abdominal aorta.
No pathologically enlarged lymph nodes in the abdomen or pelvis.

Reproductive: Apparent prostatectomy.

Other: No pneumoperitoneum, ascites or focal fluid collection.

Musculoskeletal: No aggressive appearing focal osseous lesions.
Mild-to-moderate thoracolumbar spondylosis.
IMPRESSION: 1. High-grade distal colonic obstruction due to incarcerated right
inguinal hernia containing a portion of the sigmoid colon. No
pneumatosis or pneumoperitoneum.
2. Mild platelike atelectasis at both lung bases.
3. Scattered small solid pulmonary nodules at the lung bases,
largest 4 mm, new from prior CT. Recommend follow-up chest CT in 3
months given history of prostate cancer.
4.  Aortic Atherosclerosis ([id number]).

## 2021-09-12 ENCOUNTER — Ambulatory Visit: Payer: PPO | Admitting: Gastroenterology

## 2021-09-28 DIAGNOSIS — N1831 Chronic kidney disease, stage 3a: Secondary | ICD-10-CM | POA: Diagnosis not present

## 2021-09-28 DIAGNOSIS — I10 Essential (primary) hypertension: Secondary | ICD-10-CM | POA: Diagnosis not present

## 2021-10-18 ENCOUNTER — Ambulatory Visit: Payer: PPO | Admitting: Gastroenterology

## 2021-11-03 DIAGNOSIS — R9721 Rising PSA following treatment for malignant neoplasm of prostate: Secondary | ICD-10-CM | POA: Diagnosis not present

## 2021-11-10 DIAGNOSIS — C61 Malignant neoplasm of prostate: Secondary | ICD-10-CM | POA: Diagnosis not present

## 2022-01-04 ENCOUNTER — Ambulatory Visit: Payer: PPO | Admitting: Hematology and Oncology

## 2022-01-04 ENCOUNTER — Other Ambulatory Visit: Payer: PPO

## 2022-01-13 NOTE — Progress Notes (Signed)
   Patient Care Team: Leighton Ruff, MD (Inactive) as PCP - General (Family Medicine)  DIAGNOSIS: No diagnosis found.  SUMMARY OF ONCOLOGIC HISTORY: Oncology History   No history exists.    CHIEF COMPLIANT: Follow-up of normocytic anemia   INTERVAL HISTORY: [name] is a 76 y.o. with above-mentioned history of mild anemia. He presents to the clinic today for labs and follow-up.    ALLERGIES:  has No Known Allergies.  MEDICATIONS:  Current Outpatient Medications  Medication Sig Dispense Refill   aspirin 81 MG chewable tablet Chew 81 mg by mouth daily.      atorvastatin (LIPITOR) 10 MG tablet Take 10 mg by mouth daily.     losartan-hydrochlorothiazide (HYZAAR) 100-25 MG tablet Take 0.5 tablets by mouth daily.      No current facility-administered medications for this visit.    PHYSICAL EXAMINATION: ECOG PERFORMANCE STATUS: {CHL ONC ECOG PS:[phone]}  There were no vitals filed for this visit. There were no vitals filed for this visit.  BREAST:*** No palpable masses or nodules in either right or left breasts. No palpable axillary supraclavicular or infraclavicular adenopathy no breast tenderness or nipple discharge. (exam performed in the presence of a chaperone)  LABORATORY DATA:  I have reviewed the data as listed    Latest Ref Rng & Units 07/04/2021    2:47 PM 01/04/2021    2:45 PM 09/20/2020    9:06 AM  CMP  Glucose 70 - 99 mg/dL 88  116  94   BUN 8 - 23 mg/dL '24  23  24   '$ Creatinine 0.61 - 1.24 mg/dL 1.67  1.76  1.61   Sodium 135 - 145 mmol/L 138  141  140   Potassium 3.5 - 5.1 mmol/L 4.3  4.0  4.6   Chloride 98 - 111 mmol/L 105  106  108   CO2 22 - 32 mmol/L '26  24  23   '$ Calcium 8.9 - 10.3 mg/dL 10.4  9.7  10.2   Total Protein 6.5 - 8.1 g/dL 8.4  7.7  8.1   Total Bilirubin 0.3 - 1.2 mg/dL 0.7  0.4  0.5   Alkaline Phos 38 - 126 U/L 90  85  84   AST 15 - 41 U/L '18  20  20   '$ ALT 0 - 44 U/L 20  17  32     Lab Results  Component Value Date   WBC 5.6  07/04/2021   HGB 11.4 (L) 07/04/2021   HCT 35.3 (L) 07/04/2021   MCV 96.4 07/04/2021   PLT 188 07/04/2021   NEUTROABS 3.1 07/04/2021    ASSESSMENT & PLAN:  No problem-specific Assessment & Plan notes found for this encounter.    No orders of the defined types were placed in this encounter.  The patient has a good understanding of the overall plan. he agrees with it. he will call with any problems that may develop before the next visit here. Total time spent: 30 mins including face to face time and time spent for planning, charting and co-ordination of care   Suzzette Righter, [place] 01/13/22    I Gardiner Coins am scribing for Dr. Lindi Adie  ***

## 2022-01-16 ENCOUNTER — Inpatient Hospital Stay (HOSPITAL_BASED_OUTPATIENT_CLINIC_OR_DEPARTMENT_OTHER): Payer: PPO | Admitting: Hematology and Oncology

## 2022-01-16 ENCOUNTER — Other Ambulatory Visit: Payer: Self-pay

## 2022-01-16 ENCOUNTER — Inpatient Hospital Stay: Payer: PPO | Attending: Hematology and Oncology

## 2022-01-16 VITALS — BP 126/68 | HR 59 | Temp 97.5°F | Resp 18 | Ht 70.0 in | Wt 196.9 lb

## 2022-01-16 DIAGNOSIS — D649 Anemia, unspecified: Secondary | ICD-10-CM | POA: Diagnosis not present

## 2022-01-16 LAB — IRON AND IRON BINDING CAPACITY (CC-WL,HP ONLY)
Iron: 85 ug/dL (ref 45–182)
Saturation Ratios: 30 % (ref 17.9–39.5)
TIBC: 286 ug/dL (ref 250–450)
UIBC: 201 ug/dL (ref 117–376)

## 2022-01-16 LAB — CBC WITH DIFFERENTIAL (CANCER CENTER ONLY)
Abs Immature Granulocytes: 0.01 10*3/uL (ref 0.00–0.07)
Basophils Absolute: 0 10*3/uL (ref 0.0–0.1)
Basophils Relative: 1 %
Eosinophils Absolute: 0.3 10*3/uL (ref 0.0–0.5)
Eosinophils Relative: 5 %
HCT: 28.4 % — ABNORMAL LOW (ref 39.0–52.0)
Hemoglobin: 9 g/dL — ABNORMAL LOW (ref 13.0–17.0)
Immature Granulocytes: 0 %
Lymphocytes Relative: 45 %
Lymphs Abs: 2.9 10*3/uL (ref 0.7–4.0)
MCH: 30.8 pg (ref 26.0–34.0)
MCHC: 31.7 g/dL (ref 30.0–36.0)
MCV: 97.3 fL (ref 80.0–100.0)
Monocytes Absolute: 0.4 10*3/uL (ref 0.1–1.0)
Monocytes Relative: 7 %
Neutro Abs: 2.7 10*3/uL (ref 1.7–7.7)
Neutrophils Relative %: 42 %
Platelet Count: 201 10*3/uL (ref 150–400)
RBC: 2.92 MIL/uL — ABNORMAL LOW (ref 4.22–5.81)
RDW: 13.2 % (ref 11.5–15.5)
WBC Count: 6.3 10*3/uL (ref 4.0–10.5)
nRBC: 0 % (ref 0.0–0.2)

## 2022-01-16 LAB — CMP (CANCER CENTER ONLY)
ALT: 14 U/L (ref 0–44)
AST: 15 U/L (ref 15–41)
Albumin: 4.4 g/dL (ref 3.5–5.0)
Alkaline Phosphatase: 76 U/L (ref 38–126)
Anion gap: 7 (ref 5–15)
BUN: 36 mg/dL — ABNORMAL HIGH (ref 8–23)
CO2: 24 mmol/L (ref 22–32)
Calcium: 10.6 mg/dL — ABNORMAL HIGH (ref 8.9–10.3)
Chloride: 109 mmol/L (ref 98–111)
Creatinine: 1.81 mg/dL — ABNORMAL HIGH (ref 0.61–1.24)
GFR, Estimated: 38 mL/min — ABNORMAL LOW (ref >60)
Glucose, Bld: 90 mg/dL (ref 70–99)
Potassium: 4.6 mmol/L (ref 3.5–5.1)
Sodium: 140 mmol/L (ref 135–145)
Total Bilirubin: 0.7 mg/dL (ref 0.3–1.2)
Total Protein: 7.8 g/dL (ref 6.5–8.1)

## 2022-01-16 NOTE — Assessment & Plan Note (Signed)
01/16/22: Hemoglobin 07/04/2021: Hemoglobin 11.4 01/04/2021: Hemoglobin 9.6 09/20/2020: Hemoglobin 9.6, MCV 98, creatinine 1.61, SPEP: No M protein 08/31/2020: Hemoglobin 8.6, MCV 97.5, WBC 7.3, platelets 262 06/28/2020: Hemoglobin 9.5, MCV 98.7, WBC 6.6, platelets 184 05/25/2020: Hemoglobin 9, MCV 96.2, RDW 13.9, platelets 242 05/05/2019: Hemoglobin 10.4, platelets 193 12/26/2018: Hemoglobin 10 11/27/2018: Hemoglobin 8.4, MCV 96.6, platelets 232 (patient had 2 surgeries within a month for hernias)  05/07/2017: Hemoglobin 12.5, MCV 99.6, platelets 95 05/08/2016: Hemoglobin 12.9, MCV 97.6, platelets 131 05/24/2015: Hemoglobin 13.6, MCV 98.7, platelets 193     Differential diagnosis is MDS versus anemia of chronic kidney disease   Bone marrow biopsy 09/20/2020: Normocellular marrow with mild erythroid hyperplasia, no dysplasia, FISH and cytogenetics are negative we can attribute anemia to anemia of chronic disease.   His globin is improved significantly and therefore no need of Aranesp injections for his anemia of chronic kidney disease.   Return to clinic in 6 months for labs and follow-up

## 2022-01-17 LAB — FERRITIN: Ferritin: 147 ng/mL (ref 24–336)

## 2022-03-05 DIAGNOSIS — R509 Fever, unspecified: Secondary | ICD-10-CM | POA: Diagnosis not present

## 2022-03-05 DIAGNOSIS — R197 Diarrhea, unspecified: Secondary | ICD-10-CM | POA: Diagnosis not present

## 2022-03-05 DIAGNOSIS — R5383 Other fatigue: Secondary | ICD-10-CM | POA: Diagnosis not present

## 2022-03-05 DIAGNOSIS — R531 Weakness: Secondary | ICD-10-CM | POA: Diagnosis not present

## 2022-03-05 DIAGNOSIS — R110 Nausea: Secondary | ICD-10-CM | POA: Diagnosis not present

## 2022-03-09 DIAGNOSIS — M25532 Pain in left wrist: Secondary | ICD-10-CM | POA: Diagnosis not present

## 2022-03-09 DIAGNOSIS — Z79899 Other long term (current) drug therapy: Secondary | ICD-10-CM | POA: Diagnosis not present

## 2022-03-14 DIAGNOSIS — R030 Elevated blood-pressure reading, without diagnosis of hypertension: Secondary | ICD-10-CM | POA: Diagnosis not present

## 2022-03-14 DIAGNOSIS — N179 Acute kidney failure, unspecified: Secondary | ICD-10-CM | POA: Diagnosis not present

## 2022-03-14 DIAGNOSIS — M10332 Gout due to renal impairment, left wrist: Secondary | ICD-10-CM | POA: Diagnosis not present

## 2022-04-07 ENCOUNTER — Other Ambulatory Visit: Payer: Self-pay | Admitting: Psychiatry

## 2022-04-07 DIAGNOSIS — R9721 Rising PSA following treatment for malignant neoplasm of prostate: Secondary | ICD-10-CM

## 2022-05-19 ENCOUNTER — Other Ambulatory Visit: Payer: Self-pay | Admitting: Psychiatry

## 2022-05-19 DIAGNOSIS — R9721 Rising PSA following treatment for malignant neoplasm of prostate: Secondary | ICD-10-CM

## 2022-06-26 ENCOUNTER — Telehealth: Payer: Self-pay | Admitting: Hematology and Oncology

## 2022-06-26 NOTE — Telephone Encounter (Signed)
Rescheduled appointment per provider PAL. Left voicemail. 

## 2022-07-18 ENCOUNTER — Ambulatory Visit: Payer: PPO | Admitting: Hematology and Oncology

## 2022-07-18 ENCOUNTER — Other Ambulatory Visit: Payer: PPO

## 2022-07-19 NOTE — Progress Notes (Signed)
Patient Care Team: Juluis Rainier, MD (Inactive) as PCP - General (Family Medicine)  DIAGNOSIS:  Encounter Diagnosis  Name Primary?   Normocytic anemia Yes    CHIEF COMPLIANT: Follow-up of normocytic anemia   INTERVAL HISTORY: [name] is a 77 y.o. with above-mentioned history of normocytic anemia who had a complete workup including a bone marrow biopsy which did not reveal any major abnormality. He presents to the clinic today for labs and follow-up. He reports that he feels ok. Denies any fatigue. Denies any blood in stool. He is able to exercise and go for walks.   ALLERGIES:  has No Known Allergies.  MEDICATIONS:  Current Outpatient Medications  Medication Sig Dispense Refill   aspirin 81 MG chewable tablet Chew 81 mg by mouth daily.      atorvastatin (LIPITOR) 10 MG tablet Take 10 mg by mouth daily.     ferrous gluconate (FERGON) 324 MG tablet Take 1 tablet (324 mg total) by mouth daily with breakfast. 30 tablet 6   Leuprolide Acetate, 6 Month, (LUPRON) 45 MG injection      losartan-hydrochlorothiazide (HYZAAR) 100-25 MG tablet Take 0.5 tablets by mouth daily.      olmesartan-hydrochlorothiazide (BENICAR HCT) 40-12.5 MG tablet Take 1 tablet by mouth daily.     No current facility-administered medications for this visit.    PHYSICAL EXAMINATION: ECOG PERFORMANCE STATUS: 0 - Asymptomatic  Vitals:   08/01/22 1504  BP: 126/66  Pulse: 74  Resp: 18  Temp: (!) 97.3 F (36.3 C)  SpO2: 98%   Filed Weights   08/01/22 1504  Weight: 203 lb 6.4 oz (92.3 kg)      LABORATORY DATA:  I have reviewed the data as listed    Latest Ref Rng & Units 08/01/2022    2:45 PM 01/16/2022    3:07 PM 07/04/2021    2:47 PM  CMP  Glucose 70 - 99 mg/dL 90  90  88   BUN 8 - 23 mg/dL 29  36  24   Creatinine 0.61 - 1.24 mg/dL 1.61  0.96  0.45   Sodium 135 - 145 mmol/L 139  140  138   Potassium 3.5 - 5.1 mmol/L 4.4  4.6  4.3   Chloride 98 - 111 mmol/L 108  109  105   CO2 22 - 32  mmol/L 24  24  26    Calcium 8.9 - 10.3 mg/dL 40.9  81.1  91.4   Total Protein 6.5 - 8.1 g/dL 7.4  7.8  8.4   Total Bilirubin 0.3 - 1.2 mg/dL 0.6  0.7  0.7   Alkaline Phos 38 - 126 U/L 84  76  90   AST 15 - 41 U/L 15  15  18    ALT 0 - 44 U/L 24  14  20      Lab Results  Component Value Date   WBC 7.4 08/01/2022   HGB 8.6 (L) 08/01/2022   HCT 27.4 (L) 08/01/2022   MCV 95.8 08/01/2022   PLT 194 08/01/2022   NEUTROABS 4.3 08/01/2022    ASSESSMENT & PLAN:  Normocytic anemia 01/16/22: Hemoglobin 8.6, iron saturation: 8% (started on oral iron therapy) 01/04/2021: Hemoglobin 9.6 09/20/2020: Hemoglobin 9.6, MCV 98, creatinine 1.61, SPEP: No M protein 08/31/2020: Hemoglobin 8.6, MCV 97.5, WBC 7.3, platelets 262 06/28/2020: Hemoglobin 9.5, MCV 98.7, WBC 6.6, platelets 184 05/25/2020: Hemoglobin 9, MCV 96.2, RDW 13.9, platelets 242 05/05/2019: Hemoglobin 10.4, platelets 193 12/26/2018: Hemoglobin 10 11/27/2018: Hemoglobin 8.4, MCV 96.6, platelets  232 (patient had 2 surgeries within a month for hernias)  05/07/2017: Hemoglobin 12.5, MCV 99.6, platelets 95 05/08/2016: Hemoglobin 12.9, MCV 97.6, platelets 131 05/24/2015: Hemoglobin 13.6, MCV 98.7, platelets 193     Differential diagnosis is MDS versus anemia of chronic kidney disease   Bone marrow biopsy 09/20/2020: Normocellular marrow with mild erythroid hyperplasia, no dysplasia, FISH and cytogenetics are negative we can attribute anemia to anemia of chronic disease.  Current treatment: Oral iron therapy with ferrous gluconate started 08/01/2022. Since the patient is asymptomatic we decided not to use Aranesp therapy at this time  Return to clinic in 3 months with labs and follow-up    Orders Placed This Encounter  Procedures   CBC with Differential ([facility] Only)    Standing Status:   Future    Standing Expiration Date:   08/01/2023   Ferritin    Standing Status:   Future    Standing Expiration Date:   08/01/2023   Iron and Iron  Binding Capacity ([facility] only)    Standing Status:   Future    Standing Expiration Date:   08/01/2023   The patient has a good understanding of the overall plan. he agrees with it. he will call with any problems that may develop before the next visit here. Total time spent: 30 mins including face to face time and time spent for planning, charting and co-ordination of care   Tamsen Meek, MD 08/01/22    I Janan Ridge am acting as a Neurosurgeon for [employer]  I have reviewed the above documentation for accuracy and completeness, and I agree with the above.

## 2022-08-01 ENCOUNTER — Other Ambulatory Visit: Payer: Self-pay

## 2022-08-01 ENCOUNTER — Inpatient Hospital Stay (HOSPITAL_BASED_OUTPATIENT_CLINIC_OR_DEPARTMENT_OTHER): Payer: PPO | Admitting: Hematology and Oncology

## 2022-08-01 ENCOUNTER — Inpatient Hospital Stay: Payer: PPO | Attending: Hematology and Oncology

## 2022-08-01 VITALS — BP 126/66 | HR 74 | Temp 97.3°F | Resp 18 | Ht 70.0 in | Wt 203.4 lb

## 2022-08-01 DIAGNOSIS — D649 Anemia, unspecified: Secondary | ICD-10-CM | POA: Diagnosis present

## 2022-08-01 LAB — FERRITIN: Ferritin: 147 ng/mL (ref 24–336)

## 2022-08-01 LAB — CMP (CANCER CENTER ONLY)
ALT: 24 U/L (ref 0–44)
AST: 15 U/L (ref 15–41)
Albumin: 3.8 g/dL (ref 3.5–5.0)
Alkaline Phosphatase: 84 U/L (ref 38–126)
Anion gap: 7 (ref 5–15)
BUN: 29 mg/dL — ABNORMAL HIGH (ref 8–23)
CO2: 24 mmol/L (ref 22–32)
Calcium: 10.1 mg/dL (ref 8.9–10.3)
Chloride: 108 mmol/L (ref 98–111)
Creatinine: 2.01 mg/dL — ABNORMAL HIGH (ref 0.61–1.24)
GFR, Estimated: 34 mL/min — ABNORMAL LOW (ref >60)
Glucose, Bld: 90 mg/dL (ref 70–99)
Potassium: 4.4 mmol/L (ref 3.5–5.1)
Sodium: 139 mmol/L (ref 135–145)
Total Bilirubin: 0.6 mg/dL (ref 0.3–1.2)
Total Protein: 7.4 g/dL (ref 6.5–8.1)

## 2022-08-01 LAB — CBC WITH DIFFERENTIAL (CANCER CENTER ONLY)
Abs Immature Granulocytes: 0.02 10*3/uL (ref 0.00–0.07)
Basophils Absolute: 0 10*3/uL (ref 0.0–0.1)
Basophils Relative: 0 %
Eosinophils Absolute: 0.2 10*3/uL (ref 0.0–0.5)
Eosinophils Relative: 2 %
HCT: 27.4 % — ABNORMAL LOW (ref 39.0–52.0)
Hemoglobin: 8.6 g/dL — ABNORMAL LOW (ref 13.0–17.0)
Immature Granulocytes: 0 %
Lymphocytes Relative: 31 %
Lymphs Abs: 2.3 10*3/uL (ref 0.7–4.0)
MCH: 30.1 pg (ref 26.0–34.0)
MCHC: 31.4 g/dL (ref 30.0–36.0)
MCV: 95.8 fL (ref 80.0–100.0)
Monocytes Absolute: 0.7 10*3/uL (ref 0.1–1.0)
Monocytes Relative: 9 %
Neutro Abs: 4.3 10*3/uL (ref 1.7–7.7)
Neutrophils Relative %: 58 %
Platelet Count: 194 10*3/uL (ref 150–400)
RBC: 2.86 MIL/uL — ABNORMAL LOW (ref 4.22–5.81)
RDW: 14.6 % (ref 11.5–15.5)
WBC Count: 7.4 10*3/uL (ref 4.0–10.5)
nRBC: 0 % (ref 0.0–0.2)

## 2022-08-01 LAB — IRON AND IRON BINDING CAPACITY (CC-WL,HP ONLY)
Iron: 20 ug/dL — ABNORMAL LOW (ref 45–182)
Saturation Ratios: 8 % — ABNORMAL LOW (ref 17.9–39.5)
TIBC: 262 ug/dL (ref 250–450)
UIBC: 242 ug/dL (ref 117–376)

## 2022-08-01 MED ORDER — FERROUS GLUCONATE 324 (38 FE) MG PO TABS: 324.0000 mg | ORAL_TABLET | Freq: Every day | ORAL | 6 refills | Status: AC

## 2022-08-01 NOTE — Assessment & Plan Note (Addendum)
01/16/22: Hemoglobin 8.6, iron saturation: 8% (started on oral iron therapy) 01/04/2021: Hemoglobin 9.6 09/20/2020: Hemoglobin 9.6, MCV 98, creatinine 1.61, SPEP: No M protein 08/31/2020: Hemoglobin 8.6, MCV 97.5, WBC 7.3, platelets 262 06/28/2020: Hemoglobin 9.5, MCV 98.7, WBC 6.6, platelets 184 05/25/2020: Hemoglobin 9, MCV 96.2, RDW 13.9, platelets 242 05/05/2019: Hemoglobin 10.4, platelets 193 12/26/2018: Hemoglobin 10 11/27/2018: Hemoglobin 8.4, MCV 96.6, platelets 232 (patient had 2 surgeries within a month for hernias)  05/07/2017: Hemoglobin 12.5, MCV 99.6, platelets 95 05/08/2016: Hemoglobin 12.9, MCV 97.6, platelets 131 05/24/2015: Hemoglobin 13.6, MCV 98.7, platelets 193     Differential diagnosis is MDS versus anemia of chronic kidney disease   Bone marrow biopsy 09/20/2020: Normocellular marrow with mild erythroid hyperplasia, no dysplasia, FISH and cytogenetics are negative we can attribute anemia to anemia of chronic disease.  Current treatment: Oral iron therapy with ferrous gluconate started 08/01/2022. Since the patient is asymptomatic we decided not to use Aranesp therapy at this time  Return to clinic in 3 months with labs and follow-up

## 2022-11-06 ENCOUNTER — Other Ambulatory Visit: Payer: PPO

## 2022-11-06 ENCOUNTER — Ambulatory Visit: Payer: PPO | Admitting: Hematology and Oncology

## 2022-12-08 ENCOUNTER — Ambulatory Visit
Admission: RE | Admit: 2022-12-08 | Discharge: 2022-12-08 | Disposition: A | Discharge status: Home / Self Care | Payer: PPO | Source: Ambulatory Visit | Attending: Psychiatry | Admitting: Psychiatry

## 2022-12-08 DIAGNOSIS — R9721 Rising PSA following treatment for malignant neoplasm of prostate: Secondary | ICD-10-CM

## 2023-03-14 ENCOUNTER — Other Ambulatory Visit: Payer: Self-pay | Admitting: Hematology and Oncology

## 2023-03-15 ENCOUNTER — Telehealth: Payer: Self-pay | Admitting: Hematology and Oncology

## 2023-03-15 NOTE — Telephone Encounter (Signed)
Scheduled appointments per 1/30 scheduling message. Left the patient a voice mail with the scheduled appointment dates and times. Patient will be mailed an appointment reminder.

## 2023-03-16 ENCOUNTER — Telehealth: Payer: Self-pay | Admitting: Hematology and Oncology

## 2023-03-16 NOTE — Telephone Encounter (Signed)
I received a mychart message from patient who stated would not be coming to April appointments. I called patient to confirm request and patient states yes would like to cancel appointments and no longer wants to receive services. Appointments canceled.

## 2023-05-21 ENCOUNTER — Other Ambulatory Visit: Payer: PPO

## 2023-05-23 ENCOUNTER — Ambulatory Visit: Payer: PPO | Admitting: Hematology and Oncology
